# Patient Record
Sex: Male | Born: 1955 | Race: Black or African American | Hispanic: No | Marital: Married | Smoking: Never smoker
Health system: Southern US, Community
[De-identification: ages and names within clinical notes are randomized; demographics above are authoritative.]

## PROBLEM LIST (undated history)

## (undated) DIAGNOSIS — Z8739 Personal history of other diseases of the musculoskeletal system and connective tissue: Secondary | ICD-10-CM

## (undated) DIAGNOSIS — M1990 Unspecified osteoarthritis, unspecified site: Secondary | ICD-10-CM

## (undated) DIAGNOSIS — G4730 Sleep apnea, unspecified: Secondary | ICD-10-CM

## (undated) DIAGNOSIS — I10 Essential (primary) hypertension: Secondary | ICD-10-CM

## (undated) HISTORY — PX: KNEE ARTHROSCOPY: SUR90

---

## 2012-03-29 NOTE — H&P (Signed)
TOTAL HIP ADMISSION H&P  Patient is admitted for right total hip arthroplasty.  Subjective:  Chief Complaint: right hip pain  HPI: Antonio Berg, 57 y.o. male, has a history of pain and functional disability in the right hip(s) due to arthritis and patient has failed non-surgical conservative treatments for greater than 12 weeks to include NSAID's and/or analgesics, use of assistive devices and activity modification.  Onset of symptoms was gradual starting 3 years ago with gradually worsening course since that time.The patient noted no past surgery on the right hip(s).  Patient currently rates pain in the right hip at 7 out of 10 with activity. Patient has night pain, worsening of pain with activity and weight bearing, pain that interfers with activities of daily living and pain with passive range of motion. Patient has evidence of subchondral cysts, periarticular osteophytes and joint space narrowing by imaging studies. This condition presents safety issues increasing the risk of falls.  There is no current active infection.  Past Medical History Sleep Apnea- uses CPAP Hypercholesteremia Hypertension Gout Osteoarthritis   Social History Alcohol use. current drinker; drinks beer; only occasionally per week Children. 1 Current work status. working full time Living situation. live with spouse Marital status. married Tobacco use. former smoker; smoke(d) 1 pack(s) per day  No known drug allergies   History  Substance Use Topics  . Smoking status: Former smoker  . Smokeless tobacco: None  . Alcohol Use: Drinks 2-3 per week    Family History Father deceased age 67 due to complications after a hip fracture Mother deceased age 11 due to complications of Dm type II; history of bilateral BK amputation   Review of Systems  Constitutional: Negative.   HENT: Negative.  Negative for neck pain.   Eyes: Negative.   Respiratory: Negative.   Cardiovascular: Negative.    Gastrointestinal: Negative.   Genitourinary: Negative.   Musculoskeletal: Positive for back pain and joint pain. Negative for myalgias and falls.       Right hip pain  Skin: Negative.   Neurological: Negative.   Endo/Heme/Allergies: Negative.   Psychiatric/Behavioral: Negative.     Objective:  Physical Exam  Constitutional: He is oriented to person, place, and time. He appears well-developed and well-nourished. No distress.  HENT:  Head: Normocephalic and atraumatic.  Right Ear: External ear normal.  Left Ear: External ear normal.  Nose: Nose normal.  Mouth/Throat: Oropharynx is clear and moist.  Eyes: Conjunctivae and EOM are normal.  Neck: Normal range of motion. Neck supple. No tracheal deviation present. No thyromegaly present.  Cardiovascular: Normal rate, regular rhythm, normal heart sounds and intact distal pulses.   No murmur heard. Respiratory: Effort normal. No respiratory distress. He has decreased breath sounds. He has no wheezes. He exhibits no tenderness.  GI: Soft. Bowel sounds are normal. He exhibits no distension and no mass. There is no tenderness.  Musculoskeletal:       Right hip: He exhibits decreased range of motion and decreased strength.       Left hip: Normal.       Right knee: He exhibits decreased range of motion. He exhibits no swelling, no effusion and no erythema. Tenderness found. Medial joint line tenderness noted.       Left knee: He exhibits decreased range of motion. He exhibits no swelling, no effusion and no erythema. Tenderness found. Medial joint line tenderness noted.       Right lower leg: He exhibits no tenderness and no swelling.  Left lower leg: He exhibits no tenderness and no swelling.  Right hip demonstrates no flexion beyond 90 degrees and minimal internal rotation.   Lymphadenopathy:    He has no cervical adenopathy.  Neurological: He is alert and oriented to person, place, and time. He has normal strength and normal  reflexes. No sensory deficit.  Skin: No rash noted. He is not diaphoretic. No erythema.  Psychiatric: He has a normal mood and affect. His behavior is normal.   Vitals Weight: 292 lb Height: 72 in Body Surface Area: 2.59 m Body Mass Index: 39.6 kg/m BP: 118/80 (Sitting, Right Arm, Standard)  Imaging Review Plain radiographs demonstrate severe degenerative joint disease of the right hip(s). The bone quality appears to be fair for age and reported activity level.  Assessment/Plan:  End stage arthritis, right hip(s)  The patient history, physical examination, clinical judgement of the provider and imaging studies are consistent with end stage degenerative joint disease of the right hip(s) and total hip arthroplasty is deemed medically necessary. The treatment options including medical management, injection therapy, arthroscopy and arthroplasty were discussed at length. The risks and benefits of total hip arthroplasty were presented and reviewed. The risks due to aseptic loosening, infection, stiffness, dislocation/subluxation,  thromboembolic complications and other imponderables were discussed.  The patient acknowledged the explanation, agreed to proceed with the plan and consent was signed. Patient is being admitted for inpatient treatment for surgery, pain control, PT, OT, prophylactic antibiotics, VTE prophylaxis, progressive ambulation and ADL's and discharge planning.The patient is planning to be discharged to skilled nursing facility      [place], [place]

## 2012-04-03 ENCOUNTER — Encounter (HOSPITAL_COMMUNITY): Payer: Self-pay

## 2012-04-03 ENCOUNTER — Encounter (HOSPITAL_COMMUNITY)
Admission: RE | Admit: 2012-04-03 | Discharge: 2012-04-03 | Disposition: A | Discharge status: Home / Self Care | Payer: BC Managed Care – PPO | Source: Ambulatory Visit | Attending: Orthopedic Surgery | Admitting: Orthopedic Surgery

## 2012-04-03 ENCOUNTER — Encounter (HOSPITAL_COMMUNITY): Payer: Self-pay | Admitting: Pharmacy Technician

## 2012-04-03 DIAGNOSIS — Z01812 Encounter for preprocedural laboratory examination: Secondary | ICD-10-CM | POA: Insufficient documentation

## 2012-04-03 HISTORY — DX: Sleep apnea, unspecified: G47.30

## 2012-04-03 HISTORY — DX: Essential (primary) hypertension: I10

## 2012-04-03 HISTORY — DX: Unspecified osteoarthritis, unspecified site: M19.90

## 2012-04-03 LAB — COMPREHENSIVE METABOLIC PANEL
ALT: 14 U/L (ref 0–53)
AST: 17 U/L (ref 0–37)
Albumin: 3.8 g/dL (ref 3.5–5.2)
Alkaline Phosphatase: 92 U/L (ref 39–117)
BUN: 19 mg/dL (ref 6–23)
CO2: 31 mEq/L (ref 19–32)
Calcium: 9.6 mg/dL (ref 8.4–10.5)
Chloride: 101 mEq/L (ref 96–112)
Creatinine, Ser: 0.95 mg/dL (ref 0.50–1.35)
GFR calc Af Amer: >90 mL/min (ref >90)
GFR calc non Af Amer: >90 mL/min (ref >90)
Glucose, Bld: 80 mg/dL (ref 70–99)
Potassium: 3.2 mEq/L — ABNORMAL LOW (ref 3.5–5.1)
Sodium: 142 mEq/L (ref 135–145)
Total Bilirubin: 0.2 mg/dL — ABNORMAL LOW (ref 0.3–1.2)
Total Protein: 7.9 g/dL (ref 6.0–8.3)

## 2012-04-03 LAB — URINALYSIS, ROUTINE W REFLEX MICROSCOPIC
Bilirubin Urine: NEGATIVE
Glucose, UA: NEGATIVE mg/dL
Hgb urine dipstick: NEGATIVE
Ketones, ur: NEGATIVE mg/dL
Leukocytes, UA: NEGATIVE
Nitrite: NEGATIVE
Protein, ur: NEGATIVE mg/dL
Specific Gravity, Urine: 1.016 (ref 1.005–1.030)
Urobilinogen, UA: 0.2 mg/dL (ref 0.0–1.0)
pH: 7 (ref 5.0–8.0)

## 2012-04-03 LAB — CBC
HCT: 41.1 % (ref 39.0–52.0)
Hemoglobin: 13.7 g/dL (ref 13.0–17.0)
MCH: 29.4 pg (ref 26.0–34.0)
MCHC: 33.3 g/dL (ref 30.0–36.0)

## 2012-04-03 LAB — PROTIME-INR
INR: 1 (ref 0.00–1.49)
Prothrombin Time: 13.1 seconds (ref 11.6–15.2)

## 2012-04-03 LAB — APTT: aPTT: 33 seconds (ref 24–37)

## 2012-04-03 NOTE — Patient Instructions (Signed)
Latham Kinzler  04/03/2012   Your procedure is scheduled on:  04/11/12   Report to [facility] at   0515  AM.  Call this number if you have problems the morning of surgery: [phone]   Remember:   Do not eat food or drink liquids after midnight.   Take these medicines the morning of surgery with A SIP OF WATER:    Do not wear jewelry,   Do not wear lotions, powders, or perfumes.   . Men may shave face and neck.  Do not bring valuables to the hospital.  Contacts, dentures or bridgework may not be worn into surgery.  Leave suitcase in the car. After surgery it may be brought to your room.  For patients admitted to the hospital, checkout time is 11:00 AM the day of  discharge.   SEE CHG INSTRUCTION SHEET    Please read over the following fact sheets that you were given: MRSA Information, coughing and deep breathing exercises, leg exercises, Blood transfusion Fact Sheet, Incentive Spirometry Fact Sheet                Failure to comply with these instructions may result in cancellation of your surgery.                Patient Signature ____________________________              Nurse Signature _____________________________

## 2012-04-03 NOTE — Progress Notes (Signed)
Your patient has screened at an elevated risk for Obstructive Sleep Apnea using the STOP-Bang Tool during a presurgical visit.  Patient states he has not used CPAP since 2011.  A score of 4 or greater is considered an elevated risk.

## 2012-04-04 NOTE — Progress Notes (Signed)
CMP results faxed to Dr Darrelyn Hillock by [facility] fax.

## 2012-04-04 NOTE — Progress Notes (Signed)
EKG and CXR from 03/08/12 on chart  03/08/12 Office visit note from Dr Daneil Dolin( PCP) on chart  Clearance from Dr Daneil Dolin 03/08/12 on chart

## 2012-04-11 ENCOUNTER — Encounter (HOSPITAL_COMMUNITY): Payer: Self-pay | Admitting: Anesthesiology

## 2012-04-11 ENCOUNTER — Encounter (HOSPITAL_COMMUNITY): Payer: Self-pay | Admitting: *Deleted

## 2012-04-11 ENCOUNTER — Encounter (HOSPITAL_COMMUNITY)
Admission: RE | Disposition: A | Discharge status: Home Health | Payer: Self-pay | Source: Ambulatory Visit | Attending: Orthopedic Surgery

## 2012-04-11 ENCOUNTER — Inpatient Hospital Stay (HOSPITAL_COMMUNITY)
Admission: RE | Admit: 2012-04-11 | Discharge: 2012-04-18 | DRG: 818 | Disposition: A | Discharge status: Home Health | Payer: BC Managed Care – PPO | Source: Ambulatory Visit | Attending: Orthopedic Surgery | Admitting: Orthopedic Surgery

## 2012-04-11 ENCOUNTER — Ambulatory Visit (HOSPITAL_COMMUNITY): Payer: BC Managed Care – PPO | Admitting: Anesthesiology

## 2012-04-11 ENCOUNTER — Ambulatory Visit (HOSPITAL_COMMUNITY): Payer: BC Managed Care – PPO

## 2012-04-11 DIAGNOSIS — M24559 Contracture, unspecified hip: Secondary | ICD-10-CM | POA: Diagnosis present

## 2012-04-11 DIAGNOSIS — M1610 Unilateral primary osteoarthritis, unspecified hip: Principal | ICD-10-CM | POA: Diagnosis present

## 2012-04-11 DIAGNOSIS — K630 Abscess of intestine: Secondary | ICD-10-CM | POA: Diagnosis not present

## 2012-04-11 DIAGNOSIS — M1611 Unilateral primary osteoarthritis, right hip: Secondary | ICD-10-CM | POA: Diagnosis present

## 2012-04-11 DIAGNOSIS — D62 Acute posthemorrhagic anemia: Secondary | ICD-10-CM | POA: Diagnosis not present

## 2012-04-11 DIAGNOSIS — I10 Essential (primary) hypertension: Secondary | ICD-10-CM | POA: Diagnosis present

## 2012-04-11 DIAGNOSIS — K5732 Diverticulitis of large intestine without perforation or abscess without bleeding: Secondary | ICD-10-CM | POA: Diagnosis not present

## 2012-04-11 DIAGNOSIS — M169 Osteoarthritis of hip, unspecified: Principal | ICD-10-CM | POA: Diagnosis present

## 2012-04-11 DIAGNOSIS — K5792 Diverticulitis of intestine, part unspecified, without perforation or abscess without bleeding: Secondary | ICD-10-CM | POA: Diagnosis not present

## 2012-04-11 DIAGNOSIS — Z01812 Encounter for preprocedural laboratory examination: Secondary | ICD-10-CM

## 2012-04-11 DIAGNOSIS — Z96649 Presence of unspecified artificial hip joint: Secondary | ICD-10-CM

## 2012-04-11 DIAGNOSIS — E785 Hyperlipidemia, unspecified: Secondary | ICD-10-CM | POA: Diagnosis present

## 2012-04-11 DIAGNOSIS — R5082 Postprocedural fever: Secondary | ICD-10-CM | POA: Diagnosis not present

## 2012-04-11 DIAGNOSIS — G4733 Obstructive sleep apnea (adult) (pediatric): Secondary | ICD-10-CM | POA: Diagnosis present

## 2012-04-11 HISTORY — PX: TOTAL HIP ARTHROPLASTY: SHX124

## 2012-04-11 LAB — TYPE AND SCREEN
ABO/RH(D): AB POS
Antibody Screen: NEGATIVE

## 2012-04-11 SURGERY — ARTHROPLASTY, HIP, TOTAL,POSTERIOR APPROACH
Anesthesia: General | Site: Hip | Laterality: Right | Wound class: Clean

## 2012-04-11 MED ORDER — ALUM & MAG HYDROXIDE-SIMETH 200-200-20 MG/5ML PO SUSP: 30.0000 mL | ORAL | Status: DC | PRN

## 2012-04-11 MED ORDER — ACETAMINOPHEN 10 MG/ML IV SOLN
INTRAVENOUS | Status: DC | PRN
  Administered 2012-04-11: 1000 mg via INTRAVENOUS

## 2012-04-11 MED ORDER — METOCLOPRAMIDE HCL 5 MG/ML IJ SOLN
INTRAMUSCULAR | Status: DC | PRN
  Administered 2012-04-11: 10 mg via INTRAVENOUS

## 2012-04-11 MED ORDER — KETAMINE HCL 10 MG/ML IJ SOLN
INTRAMUSCULAR | Status: DC | PRN
  Administered 2012-04-11: 10 mg via INTRAVENOUS
  Administered 2012-04-11: 5 mg via INTRAVENOUS
  Administered 2012-04-11: 50 mg via INTRAVENOUS
  Administered 2012-04-11: 5 mg via INTRAVENOUS

## 2012-04-11 MED ORDER — SODIUM CHLORIDE 0.9 % IR SOLN
Status: DC | PRN
  Administered 2012-04-11: 08:00:00

## 2012-04-11 MED ORDER — THROMBIN 5000 UNITS EX SOLR
CUTANEOUS | Status: DC | PRN
  Administered 2012-04-11: 5000 [IU] via TOPICAL

## 2012-04-11 MED ORDER — PHENOL 1.4 % MT LIQD: 1.0000 | OROMUCOSAL | Status: DC | PRN

## 2012-04-11 MED ORDER — LACTATED RINGERS IV SOLN
INTRAVENOUS | Status: DC
  Administered 2012-04-11 (×3): via INTRAVENOUS

## 2012-04-11 MED ORDER — HYDROMORPHONE HCL PF 1 MG/ML IJ SOLN
INTRAMUSCULAR | Status: AC
  Filled 2012-04-11: qty 1

## 2012-04-11 MED ORDER — LACTATED RINGERS IV SOLN: INTRAVENOUS | Status: DC

## 2012-04-11 MED ORDER — FERROUS SULFATE 325 (65 FE) MG PO TABS
325.0000 mg | ORAL_TABLET | Freq: Three times a day (TID) | ORAL | Status: DC
  Administered 2012-04-11 – 2012-04-18 (×21): 325 mg via ORAL
  Filled 2012-04-11 (×24): qty 1

## 2012-04-11 MED ORDER — HYDROMORPHONE HCL PF 1 MG/ML IJ SOLN
INTRAMUSCULAR | Status: DC | PRN
  Administered 2012-04-11: 2 mg via INTRAVENOUS
  Administered 2012-04-11 (×2): 1 mg via INTRAVENOUS

## 2012-04-11 MED ORDER — POTASSIUM CHLORIDE CRYS ER 20 MEQ PO TBCR
20.0000 meq | EXTENDED_RELEASE_TABLET | Freq: Two times a day (BID) | ORAL | Status: DC
  Administered 2012-04-11: 20 meq via ORAL
  Filled 2012-04-11 (×3): qty 1

## 2012-04-11 MED ORDER — PHENYLEPHRINE HCL 10 MG/ML IJ SOLN
INTRAMUSCULAR | Status: DC | PRN
  Administered 2012-04-11: 80 ug via INTRAVENOUS
  Administered 2012-04-11: 160 ug via INTRAVENOUS
  Administered 2012-04-11: 80 ug via INTRAVENOUS
  Administered 2012-04-11: 40 ug via INTRAVENOUS
  Administered 2012-04-11 (×2): 80 ug via INTRAVENOUS

## 2012-04-11 MED ORDER — PHENYLEPHRINE HCL 10 MG/ML IJ SOLN
10.0000 mg | INTRAVENOUS | Status: DC | PRN
  Administered 2012-04-11: 50 ug/min via INTRAVENOUS

## 2012-04-11 MED ORDER — AMLODIPINE BESYLATE 10 MG PO TABS
10.0000 mg | ORAL_TABLET | Freq: Every day | ORAL | Status: DC
  Administered 2012-04-11 – 2012-04-18 (×8): 10 mg via ORAL
  Filled 2012-04-11 (×9): qty 1

## 2012-04-11 MED ORDER — ACETAMINOPHEN 325 MG PO TABS: 650.0000 mg | ORAL_TABLET | Freq: Four times a day (QID) | ORAL | Status: DC | PRN

## 2012-04-11 MED ORDER — PROMETHAZINE HCL 25 MG/ML IJ SOLN: 6.2500 mg | INTRAMUSCULAR | Status: DC | PRN

## 2012-04-11 MED ORDER — ACETAMINOPHEN 10 MG/ML IV SOLN
INTRAVENOUS | Status: AC
  Filled 2012-04-11: qty 100

## 2012-04-11 MED ORDER — CEFAZOLIN SODIUM 1-5 GM-% IV SOLN
1.0000 g | Freq: Four times a day (QID) | INTRAVENOUS | Status: AC
  Administered 2012-04-11 (×2): 1 g via INTRAVENOUS
  Filled 2012-04-11 (×2): qty 50

## 2012-04-11 MED ORDER — CEFAZOLIN SODIUM 1-5 GM-% IV SOLN
INTRAVENOUS | Status: AC
  Filled 2012-04-11: qty 50

## 2012-04-11 MED ORDER — HYDROMORPHONE 0.3 MG/ML IV SOLN
INTRAVENOUS | Status: DC
  Administered 2012-04-11: 2.7 mg via INTRAVENOUS
  Administered 2012-04-11: 11:00:00 via INTRAVENOUS
  Administered 2012-04-11 (×2): 3 mg via INTRAVENOUS
  Administered 2012-04-12: 2.7 mg via INTRAVENOUS
  Filled 2012-04-11 (×2): qty 25

## 2012-04-11 MED ORDER — BUPIVACAINE LIPOSOME 1.3 % IJ SUSP
20.0000 mL | Freq: Once | INTRAMUSCULAR | Status: DC
  Filled 2012-04-11: qty 20

## 2012-04-11 MED ORDER — HYDROMORPHONE 0.3 MG/ML IV SOLN
INTRAVENOUS | Status: AC
  Filled 2012-04-11: qty 25

## 2012-04-11 MED ORDER — METHOCARBAMOL 500 MG PO TABS
ORAL_TABLET | ORAL | Status: AC
  Administered 2012-04-12: 500 mg via ORAL
  Filled 2012-04-11: qty 1

## 2012-04-11 MED ORDER — METHOCARBAMOL 500 MG PO TABS
500.0000 mg | ORAL_TABLET | Freq: Four times a day (QID) | ORAL | Status: DC | PRN
  Administered 2012-04-11 – 2012-04-18 (×10): 500 mg via ORAL
  Filled 2012-04-11 (×10): qty 1

## 2012-04-11 MED ORDER — OXYCODONE-ACETAMINOPHEN 5-325 MG PO TABS: 1.0000 | ORAL_TABLET | ORAL | Status: DC | PRN

## 2012-04-11 MED ORDER — BISACODYL 10 MG RE SUPP: 10.0000 mg | Freq: Every day | RECTAL | Status: DC | PRN

## 2012-04-11 MED ORDER — ONDANSETRON HCL 4 MG/2ML IJ SOLN
4.0000 mg | Freq: Four times a day (QID) | INTRAMUSCULAR | Status: DC | PRN
  Administered 2012-04-16: 4 mg via INTRAVENOUS
  Filled 2012-04-11: qty 2

## 2012-04-11 MED ORDER — ONDANSETRON HCL 4 MG PO TABS: 4.0000 mg | ORAL_TABLET | Freq: Four times a day (QID) | ORAL | Status: DC | PRN

## 2012-04-11 MED ORDER — 0.9 % SODIUM CHLORIDE (POUR BTL) OPTIME
TOPICAL | Status: DC | PRN
  Administered 2012-04-11: 1000 mL

## 2012-04-11 MED ORDER — OXYCODONE-ACETAMINOPHEN 10-325 MG PO TABS: 1.0000 | ORAL_TABLET | ORAL | Status: DC | PRN

## 2012-04-11 MED ORDER — DEXTROSE 5 % IV SOLN
3.0000 g | INTRAVENOUS | Status: AC
  Administered 2012-04-11: 3 g via INTRAVENOUS

## 2012-04-11 MED ORDER — FENTANYL CITRATE 0.05 MG/ML IJ SOLN
INTRAMUSCULAR | Status: DC | PRN
  Administered 2012-04-11: 25 ug via INTRAVENOUS
  Administered 2012-04-11: 100 ug via INTRAVENOUS
  Administered 2012-04-11: 150 ug via INTRAVENOUS
  Administered 2012-04-11 (×2): 50 ug via INTRAVENOUS

## 2012-04-11 MED ORDER — FLEET ENEMA 7-19 GM/118ML RE ENEM: 1.0000 | ENEMA | Freq: Once | RECTAL | Status: AC | PRN

## 2012-04-11 MED ORDER — CEFAZOLIN SODIUM-DEXTROSE 2-3 GM-% IV SOLR
INTRAVENOUS | Status: AC
  Filled 2012-04-11: qty 50

## 2012-04-11 MED ORDER — HYDROMORPHONE HCL 2 MG PO TABS
2.0000 mg | ORAL_TABLET | Freq: Three times a day (TID) | ORAL | Status: DC
  Administered 2012-04-11 – 2012-04-18 (×21): 2 mg via ORAL
  Filled 2012-04-11 (×21): qty 1

## 2012-04-11 MED ORDER — BUPIVACAINE LIPOSOME 1.3 % IJ SUSP
INTRAMUSCULAR | Status: DC | PRN
  Administered 2012-04-11: 09:00:00

## 2012-04-11 MED ORDER — ACETAMINOPHEN 650 MG RE SUPP: 650.0000 mg | Freq: Four times a day (QID) | RECTAL | Status: DC | PRN

## 2012-04-11 MED ORDER — MIDAZOLAM HCL 5 MG/5ML IJ SOLN
INTRAMUSCULAR | Status: DC | PRN
  Administered 2012-04-11: 2 mg via INTRAVENOUS

## 2012-04-11 MED ORDER — TRIAMTERENE-HCTZ 37.5-25 MG PO CAPS
1.0000 | ORAL_CAPSULE | Freq: Two times a day (BID) | ORAL | Status: DC
  Administered 2012-04-11 – 2012-04-14 (×5): 1 via ORAL
  Filled 2012-04-11 (×8): qty 1

## 2012-04-11 MED ORDER — CHLORHEXIDINE GLUCONATE 4 % EX LIQD
60.0000 mL | Freq: Once | CUTANEOUS | Status: DC
  Filled 2012-04-11: qty 60

## 2012-04-11 MED ORDER — METHOCARBAMOL 100 MG/ML IJ SOLN: 500.0000 mg | Freq: Four times a day (QID) | INTRAVENOUS | Status: DC | PRN

## 2012-04-11 MED ORDER — ALLOPURINOL 100 MG PO TABS
100.0000 mg | ORAL_TABLET | Freq: Two times a day (BID) | ORAL | Status: DC
  Administered 2012-04-11 – 2012-04-17 (×13): 100 mg via ORAL
  Filled 2012-04-11 (×15): qty 1

## 2012-04-11 MED ORDER — MENTHOL 3 MG MT LOZG: 1.0000 | LOZENGE | OROMUCOSAL | Status: DC | PRN

## 2012-04-11 MED ORDER — MEPERIDINE HCL 50 MG/ML IJ SOLN
6.2500 mg | INTRAMUSCULAR | Status: DC | PRN
  Administered 2012-04-11: 12.5 mg via INTRAVENOUS

## 2012-04-11 MED ORDER — POLYETHYLENE GLYCOL 3350 17 G PO PACK
17.0000 g | PACK | Freq: Every day | ORAL | Status: DC | PRN
  Filled 2012-04-11: qty 1

## 2012-04-11 MED ORDER — OXYCODONE HCL 5 MG PO TABS
5.0000 mg | ORAL_TABLET | ORAL | Status: DC | PRN
  Administered 2012-04-12: 5 mg via ORAL
  Filled 2012-04-11: qty 1

## 2012-04-11 MED ORDER — HYDROMORPHONE HCL PF 1 MG/ML IJ SOLN
0.2500 mg | INTRAMUSCULAR | Status: DC | PRN
  Administered 2012-04-11 (×4): 0.5 mg via INTRAVENOUS

## 2012-04-11 MED ORDER — THROMBIN 5000 UNITS EX SOLR
CUTANEOUS | Status: AC
  Filled 2012-04-11: qty 5000

## 2012-04-11 MED ORDER — BACITRACIN ZINC 500 UNIT/GM EX OINT
TOPICAL_OINTMENT | CUTANEOUS | Status: AC
  Filled 2012-04-11: qty 15

## 2012-04-11 MED ORDER — LACTATED RINGERS IV SOLN
INTRAVENOUS | Status: DC
  Administered 2012-04-11 – 2012-04-12 (×2): via INTRAVENOUS

## 2012-04-11 MED ORDER — ROCURONIUM BROMIDE 100 MG/10ML IV SOLN
INTRAVENOUS | Status: DC | PRN
  Administered 2012-04-11: 50 mg via INTRAVENOUS

## 2012-04-11 MED ORDER — RIVAROXABAN 10 MG PO TABS
10.0000 mg | ORAL_TABLET | Freq: Every day | ORAL | Status: DC
  Administered 2012-04-12 – 2012-04-15 (×4): 10 mg via ORAL
  Filled 2012-04-11 (×5): qty 1

## 2012-04-11 MED ORDER — MEPERIDINE HCL 50 MG/ML IJ SOLN
INTRAMUSCULAR | Status: AC
  Filled 2012-04-11: qty 1

## 2012-04-11 SURGICAL SUPPLY — 52 items
BAG ZIPLOCK 12X15 (MISCELLANEOUS) ×2 IMPLANT
BLADE SAW SAG 73X25 THK (BLADE) ×1
BLADE SAW SGTL 73X25 THK (BLADE) ×1 IMPLANT
BNDG COHESIVE 6X5 TAN STRL LF (GAUZE/BANDAGES/DRESSINGS) ×2 IMPLANT
CLOTH BEACON ORANGE TIMEOUT ST (SAFETY) ×2 IMPLANT
DRAPE INCISE IOBAN 66X45 STRL (DRAPES) ×2 IMPLANT
DRAPE INCISE IOBAN 85X60 (DRAPES) ×2 IMPLANT
DRAPE ORTHO SPLIT 77X108 STRL (DRAPES) ×2
DRAPE POUCH INSTRU U-SHP 10X18 (DRAPES) ×2 IMPLANT
DRAPE SURG 17X11 SM STRL (DRAPES) ×2 IMPLANT
DRAPE SURG ORHT 6 SPLT 77X108 (DRAPES) ×2 IMPLANT
DRAPE U-SHAPE 47X51 STRL (DRAPES) ×2 IMPLANT
DRSG EMULSION OIL 3X16 NADH (GAUZE/BANDAGES/DRESSINGS) ×2 IMPLANT
DRSG MEPILEX BORDER 4X12 (GAUZE/BANDAGES/DRESSINGS) ×2 IMPLANT
DRSG MEPILEX BORDER 4X4 (GAUZE/BANDAGES/DRESSINGS) ×6 IMPLANT
DRSG PAD ABDOMINAL 8X10 ST (GAUZE/BANDAGES/DRESSINGS) IMPLANT
DURAPREP 26ML APPLICATOR (WOUND CARE) ×2 IMPLANT
ELECT BLADE TIP CTD 4 INCH (ELECTRODE) ×2 IMPLANT
ELECT REM PT RETURN 9FT ADLT (ELECTROSURGICAL) ×2
ELECTRODE REM PT RTRN 9FT ADLT (ELECTROSURGICAL) ×1 IMPLANT
EVACUATOR 1/8 PVC DRAIN (DRAIN) ×2 IMPLANT
FACESHIELD LNG OPTICON STERILE (SAFETY) ×10 IMPLANT
FLOSEAL 10ML (HEMOSTASIS) ×2 IMPLANT
GLOVE BIOGEL M 6.5 STRL (GLOVE) ×2 IMPLANT
GLOVE BIOGEL PI IND STRL 8 (GLOVE) ×1 IMPLANT
GLOVE BIOGEL PI IND STRL 8.5 (GLOVE) ×1 IMPLANT
GLOVE BIOGEL PI INDICATOR 8 (GLOVE) ×1
GLOVE BIOGEL PI INDICATOR 8.5 (GLOVE) ×1
GLOVE ECLIPSE 8.0 STRL XLNG CF (GLOVE) ×8 IMPLANT
GOWN PREVENTION PLUS LG XLONG (DISPOSABLE) ×4 IMPLANT
GOWN STRL REIN XL XLG (GOWN DISPOSABLE) ×4 IMPLANT
IMMOBILIZER KNEE 20 (SOFTGOODS)
IMMOBILIZER KNEE 20 THIGH 36 (SOFTGOODS) IMPLANT
KIT BASIN OR (CUSTOM PROCEDURE TRAY) ×2 IMPLANT
MANIFOLD NEPTUNE II (INSTRUMENTS) ×2 IMPLANT
NEEDLE HYPO 22GX1.5 SAFETY (NEEDLE) ×2 IMPLANT
PACK TOTAL JOINT (CUSTOM PROCEDURE TRAY) ×2 IMPLANT
POSITIONER SURGICAL ARM (MISCELLANEOUS) ×2 IMPLANT
SPONGE GAUZE 4X4 12PLY (GAUZE/BANDAGES/DRESSINGS) ×2 IMPLANT
SPONGE LAP 18X18 X RAY DECT (DISPOSABLE) ×6 IMPLANT
SPONGE LAP 4X18 X RAY DECT (DISPOSABLE) ×2 IMPLANT
SPONGE SURGIFOAM ABS GEL 100 (HEMOSTASIS) ×2 IMPLANT
STAPLER VISISTAT 35W (STAPLE) ×2 IMPLANT
SUCTION FRAZIER TIP 10 FR DISP (SUCTIONS) ×2 IMPLANT
SUT VIC AB 0 CT1 27 (SUTURE) ×2
SUT VIC AB 0 CT1 27XBRD ANTBC (SUTURE) ×2 IMPLANT
SUT VIC AB 1 CT1 27 (SUTURE) ×5
SUT VIC AB 1 CT1 27XBRD ANTBC (SUTURE) ×5 IMPLANT
SYR 20CC LL (SYRINGE) ×2 IMPLANT
TOWEL OR 17X26 10 PK STRL BLUE (TOWEL DISPOSABLE) ×4 IMPLANT
TRAY FOLEY CATH 14FRSI W/METER (CATHETERS) ×2 IMPLANT
WATER STERILE IRR 1500ML POUR (IV SOLUTION) ×2 IMPLANT

## 2012-04-11 NOTE — Anesthesia Postprocedure Evaluation (Signed)
  Anesthesia Post-op Note  Patient: Antonio Berg  Procedure(s) Performed: Procedure(s) (LRB): TOTAL HIP ARTHROPLASTY (Right)  Patient Location: PACU  Anesthesia Type: General  Level of Consciousness: awake and alert   Airway and Oxygen Therapy: Patient Spontanous Breathing  Post-op Pain: mild  Post-op Assessment: Post-op Vital signs reviewed, Patient's Cardiovascular Status Stable, Respiratory Function Stable, Patent Airway and No signs of Nausea or vomiting  Last Vitals:  Filed Vitals:   04/11/12 1000  BP: 125/97  Pulse: 77  Temp:   Resp: 21    Post-op Vital Signs: stable   Complications: No apparent anesthesia complications

## 2012-04-11 NOTE — Progress Notes (Signed)
Utilization review completed.  

## 2012-04-11 NOTE — Transfer of Care (Signed)
Immediate Anesthesia Transfer of Care Note  Patient: Antonio Berg  Procedure(s) Performed: Procedure(s): TOTAL HIP ARTHROPLASTY (Right)  Patient Location: PACU  Anesthesia Type:General  Level of Consciousness: awake, patient cooperative and responds to stimulation  Airway & Oxygen Therapy: Patient Spontanous Breathing and Patient connected to face mask oxygen  Post-op Assessment: Report given to PACU RN, Post -op Vital signs reviewed and stable and Patient moving all extremities  Post vital signs: Reviewed and stable  Complications: No apparent anesthesia complications

## 2012-04-11 NOTE — Progress Notes (Signed)
Clinical Social Work Department CLINICAL SOCIAL WORK PLACEMENT NOTE 04/11/2012  Patient:  Antonio Berg, Antonio Berg  Account Number:  [id number] Admit date:  04/11/2012  Clinical Social Worker:  Cori Razor, LCSW  Date/time:  04/11/2012 03:16 PM  Clinical Social Work is seeking post-discharge placement for this patient at the following level of care:   SKILLED NURSING   (*CSW will update this form in Epic as items are completed)   04/11/2012  Patient/family provided with [facility] Department of Clinical Social Work's list of facilities offering this level of care within the geographic area requested by the patient (or if unable, by the patient's family).  04/11/2012  Patient/family informed of their freedom to choose among providers that offer the needed level of care, that participate in Medicare, Medicaid or managed care program needed by the patient, have an available bed and are willing to accept the patient.    Patient/family informed of [facility]' ownership interest in [facility], as well as of the fact that they are under no obligation to receive care at this facility.  PASARR submitted to EDS on 04/11/2012 PASARR number received from EDS on 04/11/2012  FL2 transmitted to all facilities in geographic area requested by pt/family on  04/11/2012 FL2 transmitted to all facilities within larger geographic area on   Patient informed that his/her managed care company has contracts with or will negotiate with  certain facilities, including the following:     Patient/family informed of bed offers received:   Patient chooses bed at  Physician recommends and patient chooses bed at    Patient to be transferred to  on   Patient to be transferred to facility by   The following physician request were entered in Epic:   Additional Comments:  Cori Razor LCSW [phone]

## 2012-04-11 NOTE — Interval H&P Note (Signed)
History and Physical Interval Note:  04/11/2012 7:09 AM  Shelia Media  has presented today for surgery, with the diagnosis of OA RIGHT HIP   The various methods of treatment have been discussed with the patient and family. After consideration of risks, benefits and other options for treatment, the patient has consented to  Procedure(s): TOTAL HIP ARTHROPLASTY (Right) as a surgical intervention .  The patient's history has been reviewed, patient examined, no change in status, stable for surgery.  I have reviewed the patient's chart and labs.  Questions were answered to the patient's satisfaction.     [name] A

## 2012-04-11 NOTE — Anesthesia Preprocedure Evaluation (Addendum)
Anesthesia Evaluation  Patient identified by MRN, date of birth, ID band Patient awake    Reviewed: Allergy & Precautions, H&P , NPO status , Patient's Chart, lab work & pertinent test results  Airway Mallampati: II  TM Distance: >3 FB Neck ROM: Full    Dental no notable dental hx. (+) Partial Upper   Pulmonary neg pulmonary ROS, sleep apnea , former smoker,  breath sounds clear to auscultation  Pulmonary exam normal       Cardiovascular hypertension, Pt. on medications negative cardio ROS  Rhythm:Regular Rate:Normal     Neuro/Psych negative neurological ROS  negative psych ROS   GI/Hepatic negative GI ROS, Neg liver ROS,   Endo/Other  negative endocrine ROS  Renal/GU negative Renal ROS  negative genitourinary   Musculoskeletal negative musculoskeletal ROS (+)   Abdominal   Peds negative pediatric ROS (+)  Hematology negative hematology ROS (+)   Anesthesia Other Findings   Reproductive/Obstetrics negative OB ROS                             Anesthesia Physical  Anesthesia Plan  ASA: II  Anesthesia Plan: General   Post-op Pain Management:    Induction: Intravenous  Airway Management Planned: Oral ETT  Additional Equipment:   Intra-op Plan:   Post-operative Plan: Extubation in OR  Informed Consent: I have reviewed the patients History and Physical, chart, labs and discussed the procedure including the risks, benefits and alternatives for the proposed anesthesia with the patient or authorized representative who has indicated his/her understanding and acceptance.   Dental advisory given  Plan Discussed with: CRNA  Anesthesia Plan Comments:         Anesthesia Quick Evaluation  

## 2012-04-11 NOTE — Preoperative (Signed)
Beta Blockers   Reason not to administer Beta Blockers:Not Applicable, not on home BB 

## 2012-04-11 NOTE — Progress Notes (Signed)
Clinical Social Work Department BRIEF PSYCHOSOCIAL ASSESSMENT 04/11/2012  Patient:  Antonio Berg, Antonio Berg     Account Number:  [id number]     Admit date:  04/11/2012  Clinical Social Worker:  Candie Chroman  Date/Time:  04/11/2012 03:25 PM  Referred by:  Physician  Date Referred:  04/11/2012 Referred for  SNF Placement   Other Referral:   Interview type:  Patient Other interview type:    PSYCHOSOCIAL DATA Living Status:  WIFE Admitted from facility:   Level of care:   Primary support name:  Kathie Rhodes Primary support relationship to patient:  SPOUSE Degree of support available:   supportive    CURRENT CONCERNS Current Concerns  Post-Acute Placement   Other Concerns:    SOCIAL WORK ASSESSMENT / PLAN Pt is a 57 yr old gentleman living at home prior to hospitalization. CSW met with pt/spouse to assist with d/c planning. [place] d/c . CSW will initiate SNF search an provide bed offers as received. CSW will continue to follow to assist with d/c planning to SNF.   Assessment/plan status:  Psychosocial Support/Ongoing Assessment of Needs Other assessment/ plan:   Information/referral to community resources:   SNF list provided. [employer] insurance authorization for SNF placement reviewed.    PATIENT'S/FAMILY'S RESPONSE TO PLAN OF CARE: Pt would like to have ST Rehab before returning home. CSW explained BCBS will need to provide authorization for SNF prior to d/c in order for insurance to cover cost of placement. Spouse indicated understanding.   Cori Razor LCSW [phone]

## 2012-04-11 NOTE — Brief Op Note (Signed)
04/11/2012  9:30 AM  PATIENT:  Antonio Berg  57 y.o. male  PRE-OPERATIVE DIAGNOSIS:  OA RIGHT HIP,VERY COMPLEX.and Morbid Obesity.  POST-OPERATIVE DIAGNOSIS:  OA RIGHT HIP,VERY COMPLEX with Morbid Obesity.  PROCEDURE:  Procedure(s): TOTAL HIP ARTHROPLASTY (Right) with Bone Graft to Acetabulum.Complex Procedure.  SURGEON:  Surgeon(s) and Role:    * Jacki Cones, MD - Primary    * Shelda Pal, MD - Assisting and Annamary Rummage  PHYSICIAN ASSISTANT: Freddie Breech pa  ASSISTANTS: mATT oLIN md  ANESTHESIA:   general  EBL:  Total I/O In: 1000 [I.V.:1000] Out: 850 [Urine:100; Blood:750]  BLOOD ADMINISTERED:none  DRAINS: (ONE) Hemovact drain(s) in the rIGHT hIP with  Suction Open   LOCAL MEDICATIONS USED:  BUPIVICAINE 20CC MIXED WITH 20CC OF NORMAL SALINE  SPECIMEN:  No Specimen  DISPOSITION OF SPECIMEN:  N/A  COUNTS:  YES  TOURNIQUET:  * No tourniquets in log *  DICTATION: .Other Dictation: Dictation Number [id number]  PLAN OF CARE: Admit to inpatient   PATIENT DISPOSITION:  Admit   Delay start of Pharmacological VTE agent (>24hrs) due to surgical blood loss or risk of bleeding: yes

## 2012-04-11 NOTE — Anesthesia Procedure Notes (Addendum)
Procedure Name: MAC Date/Time: 04/11/2012 7:35 AM Performed by: Randon Goldsmith CATHERINE PAYNE Pre-anesthesia Checklist: Patient identified, Emergency Drugs available, Suction available and Patient being monitored Patient Re-evaluated:Patient Re-evaluated prior to inductionOxygen Delivery Method: Circle system utilized Preoxygenation: Pre-oxygenation with 100% oxygen Intubation Type: IV induction Ventilation: Mask ventilation without difficulty and Oral airway inserted - appropriate to patient size Laryngoscope Size: Mac and 4 Grade View: Grade II Tube type: Oral Tube size: 7.5 mm Number of attempts: 2 Airway Equipment and Method: Bougie stylet Placement Confirmation: positive ETCO2 and breath sounds checked- equal and bilateral Secured at: 21 cm Tube secured with: Tape Dental Injury: Teeth and Oropharynx as per pre-operative assessment  Comments: 0732- DVL x 1 with mac 4  By CRNA, grade 2 view, AOI, 3 breaths given, gastric secretions noted in  ETT, 2952-  ETT immediately suctioned and removed. Patient bagged with 100 % O2. Sats down to mid 80's, quickly came up to 100%.0735-  DVL x 1 by MDA, grade 2 view, AOI with bougie stylet. Position confirmed with Bil. Breath sounds, + EtCO2, and equal chest rise and fall. Ett suctioned out, no secretions present. OGT inserted atraumatically, no resistance, no heme, + bile draining into canister via low suction, 200 ml secretions emptied quickly from OGT, then placed to gravity to drain.

## 2012-04-12 ENCOUNTER — Encounter (HOSPITAL_COMMUNITY): Payer: Self-pay | Admitting: Orthopedic Surgery

## 2012-04-12 DIAGNOSIS — D62 Acute posthemorrhagic anemia: Secondary | ICD-10-CM | POA: Diagnosis not present

## 2012-04-12 LAB — BASIC METABOLIC PANEL
CO2: 26 mEq/L (ref 19–32)
Chloride: 99 mEq/L (ref 96–112)
Creatinine, Ser: 0.83 mg/dL (ref 0.50–1.35)

## 2012-04-12 LAB — CBC
MCV: 86.3 fL (ref 78.0–100.0)
Platelets: 225 10*3/uL (ref 150–400)
RBC: 3.44 MIL/uL — ABNORMAL LOW (ref 4.22–5.81)
WBC: 11.6 10*3/uL — ABNORMAL HIGH (ref 4.0–10.5)

## 2012-04-12 MED ORDER — OXYCODONE-ACETAMINOPHEN 5-325 MG PO TABS
1.0000 | ORAL_TABLET | ORAL | Status: DC | PRN
  Administered 2012-04-13 – 2012-04-18 (×9): 1 via ORAL
  Filled 2012-04-12 (×9): qty 1

## 2012-04-12 MED ORDER — POTASSIUM CHLORIDE CRYS ER 20 MEQ PO TBCR
40.0000 meq | EXTENDED_RELEASE_TABLET | Freq: Two times a day (BID) | ORAL | Status: DC
  Administered 2012-04-12 – 2012-04-17 (×12): 40 meq via ORAL
  Filled 2012-04-12 (×14): qty 2

## 2012-04-12 MED ORDER — OXYCODONE HCL 5 MG PO TABS
5.0000 mg | ORAL_TABLET | ORAL | Status: DC | PRN
  Administered 2012-04-12 (×2): 15 mg via ORAL
  Administered 2012-04-12: 10 mg via ORAL
  Administered 2012-04-12 – 2012-04-13 (×4): 15 mg via ORAL
  Administered 2012-04-15 (×3): 5 mg via ORAL
  Administered 2012-04-16: 10 mg via ORAL
  Administered 2012-04-16 – 2012-04-18 (×8): 15 mg via ORAL
  Filled 2012-04-12 (×3): qty 3
  Filled 2012-04-12: qty 1
  Filled 2012-04-12: qty 3
  Filled 2012-04-12 (×2): qty 1
  Filled 2012-04-12: qty 3
  Filled 2012-04-12 (×2): qty 2
  Filled 2012-04-12 (×9): qty 3

## 2012-04-12 NOTE — Progress Notes (Signed)
Subjective: 1 Day Post-Op Procedure(s) (LRB): TOTAL HIP ARTHROPLASTY (Right) Patient reports pain as 4 on 0-10 scale.up ion chair this afternoon and feeling much better. Drain is out.   Objective: Vital signs in last 24 hours: Temp:  [98.3 F (36.8 C)-99.7 F (37.6 C)] 98.4 F (36.9 C) (03/28 1040) Pulse Rate:  [72-98] 81 (03/28 1040) Resp:  [14-20] 16 (03/28 1040) BP: (104-159)/(67-88) 115/76 mmHg (03/28 1040) SpO2:  [98 %-100 %] 98 % (03/28 1040) FiO2 (%):  [99 %-100 %] 99 % (03/28 0350)  Intake/Output from previous day: 03/27 0701 - 03/28 0700 In: 5185 [P.O.:960; I.V.:4125; IV Piggyback:100] Out: 3655 [Urine:2275; Drains:630; Blood:750] Intake/Output this shift: Total I/O In: 480 [P.O.:480] Out: 475 [Urine:475]   Recent Labs  04/12/12 0430  HGB 10.2*    Recent Labs  04/12/12 0430  WBC 11.6*  RBC 3.44*  HCT 29.7*  PLT 225    Recent Labs  04/12/12 0430  NA 136  K 2.9*  CL 99  CO2 26  BUN 16  CREATININE 0.83  GLUCOSE 137*  CALCIUM 8.4   No results found for this basename: LABPT, INR,  in the last 72 hours  Dorsiflexion/Plantar flexion intact  Assessment/Plan: 1 Day Post-Op Procedure(s) (LRB): TOTAL HIP ARTHROPLASTY (Right) Up with therapy,Planning SNF for Monday.  Giovanna Kemmerer A 04/12/2012, 1:49 PM

## 2012-04-12 NOTE — Progress Notes (Signed)
   Subjective: 1 Day Post-Op Procedure(s) (LRB): TOTAL HIP ARTHROPLASTY (Right) Patient reports pain as moderate.   Patient seen in rounds without Dr. Darrelyn Hillock. Patient is well, but has had some minor complaints of pain in the right hip, requiring pain medications. He denies shortness of breath and chest pain. Reports that he did get some rest last night.  We will start therapy today.  Plan is to go Skilled nursing facility after hospital stay.  Objective: Vital signs in last 24 hours: Temp:  [97.6 F (36.4 C)-99.7 F (37.6 C)] 98.6 F (37 C) (03/28 0510) Pulse Rate:  [72-98] 86 (03/28 0510) Resp:  [12-22] 16 (03/28 0510) BP: (104-180)/(67-97) 104/71 mmHg (03/28 0510) SpO2:  [93 %-100 %] 98 % (03/28 0510) FiO2 (%):  [99 %-100 %] 99 % (03/28 0350) Weight:  [129.275 kg (285 lb)] 129.275 kg (285 lb) (03/27 1100)  Intake/Output from previous day:  Intake/Output Summary (Last 24 hours) at 04/12/12 0743 Last data filed at 04/12/12 0600  Gross per 24 hour  Intake   5185 ml  Output   3655 ml  Net   1530 ml     Labs:  Recent Labs  04/12/12 0430  HGB 10.2*    Recent Labs  04/12/12 0430  WBC 11.6*  RBC 3.44*  HCT 29.7*  PLT 225    Recent Labs  04/12/12 0430  NA 136  K 2.9*  CL 99  CO2 26  BUN 16  CREATININE 0.83  GLUCOSE 137*  CALCIUM 8.4    EXAM General - Patient is Alert and Oriented Extremity - Neurologically intact Dorsiflexion/Plantar flexion intact Incision: scant drainage Compartment soft Dressing changed Motor Function - intact, moving foot and toes well on exam.  Hemovac pulled without difficulty.  Past Medical History  Diagnosis Date  . Hypertension   . Sleep apnea     not used since 2011   . Arthritis     Assessment/Plan: 1 Day Post-Op Procedure(s) (LRB): TOTAL HIP ARTHROPLASTY (Right) Active Problems:   Osteoarthritis of right hip  Estimated body mass index is 37.61 kg/(m^2) as calculated from the following:   Height as of this  encounter: 6\' 1"  (1.854 m).   Weight as of this encounter: 129.275 kg (285 lb). Advance diet Up with therapy Discharge to SNF tentative Monday  DVT Prophylaxis - Xarelto PWB 50% right leg D/C Knee Immobilizer Hemovac Pulled Begin Therapy Hip Preacutions  Potassium is low at 2.9. Will increase KDur to BID. Will recheck labs in the morning. Hgb stable at 10.2 this morning. Reinforce dressing as needed. Discontinued PCA and will manage pain with PO meds.  Antonio Berg LAUREN 04/12/2012, 7:43 AM

## 2012-04-12 NOTE — Progress Notes (Signed)
CSW assisting with d/c planning. Pt has chosen [facility] for ST SNF placement. SNF is able to admit once BCBS have provided authorization for placement. CSW will provide clinical info once PT / OT Evals are available. SNF is able to provide a pvt room as requested by pt. CSW will follow to assist with d/c planning to SNF.  Cori Razor LCSW [phone]

## 2012-04-12 NOTE — Evaluation (Signed)
Physical Therapy Evaluation Patient Details Name: Antonio Berg MRN: [id number] DOB: November 20, 1955 Today's Date: 04/12/2012 Time: 7829-5621 PT Time Calculation (min): 36 min  PT Assessment / Plan / Recommendation Clinical Impression  Pt s/p R THR presents with decreased R LE strength/ROM, post THP, PWB status and post op pain limiting functional mobility    PT Assessment  Patient needs continued PT services    Follow Up Recommendations  SNF    Does the patient have the potential to tolerate intense rehabilitation      Barriers to Discharge        Equipment Recommendations  Rolling walker with 5" wheels    Recommendations for Other Services OT consult   Frequency 7X/week    Precautions / Restrictions Precautions Precautions: Posterior Hip;Fall Precaution Comments: Sign hung in room Restrictions Weight Bearing Restrictions: Yes RLE Weight Bearing: Partial weight bearing RLE Partial Weight Bearing Percentage or Pounds: 50%   Pertinent Vitals/Pain 9/10; premed, ice pack provided, RN aware      Mobility  Bed Mobility Bed Mobility: Supine to Sit Supine to Sit: 1: +2 Total assist Supine to Sit: Patient Percentage: 50% Details for Bed Mobility Assistance: cues for sequence, THP and use of L LE to self assist.  Physical assist to manage R LE and to bring trunk to upright Transfers Transfers: Sit to Stand;Stand to Sit Sit to Stand: 1: +2 Total assist Sit to Stand: Patient Percentage: 60% Stand to Sit: 1: +2 Total assist Stand to Sit: Patient Percentage: 60% Details for Transfer Assistance: cues for LE management, adherence to THP and use of UEs to self assist Ambulation/Gait Ambulation/Gait Assistance: 1: +2 Total assist Ambulation/Gait: Patient Percentage: 80% Ambulation Distance (Feet): 31 Feet Assistive device: Rolling walker Ambulation/Gait Assistance Details: cues for sequence, posture, and position from RW Gait Pattern: Step-to pattern    Exercises Total Joint  Exercises Ankle Circles/Pumps: AROM;Both;15 reps;Supine Quad Sets: AROM;Both;10 reps;Supine Heel Slides: AAROM;10 reps;Supine;Right Hip ABduction/ADduction: AAROM;10 reps;Supine;Right   PT Diagnosis: Difficulty walking  PT Problem List: Decreased strength;Decreased range of motion;Decreased activity tolerance;Decreased mobility;Decreased knowledge of use of DME;Obesity;Pain;Decreased knowledge of precautions PT Treatment Interventions: DME instruction;Gait training;Stair training;Functional mobility training;Therapeutic activities;Therapeutic exercise;Patient/family education   PT Goals Acute Rehab PT Goals PT Goal Formulation: With patient Time For Goal Achievement: 04/17/12 Potential to Achieve Goals: Good Pt will go Supine/Side to Sit: with supervision PT Goal: Supine/Side to Sit - Progress: Goal set today Pt will go Sit to Supine/Side: with supervision PT Goal: Sit to Supine/Side - Progress: Goal set today Pt will go Sit to Stand: with supervision PT Goal: Sit to Stand - Progress: Goal set today Pt will go Stand to Sit: with supervision PT Goal: Stand to Sit - Progress: Goal set today Pt will Ambulate: 51 - 150 feet;with supervision;with rolling walker PT Goal: Ambulate - Progress: Goal set today  Visit Information  Last PT Received On: 04/12/12 Assistance Needed: +2    Subjective Data  Subjective: I had to pull myself up and down on the scaffold at work Patient Stated Goal: Get back to work ad the [employer] ( )   Prior Functioning  Home Living Lives With: Spouse Available Help at Discharge: Family Type of Home: House Home Access: Level entry Home Layout: One level Home Adaptive Equipment: Straight cane Prior Function Level of Independence: Independent Driving: Yes Vocation: Full time employment Communication Communication: No difficulties    Cognition  Cognition Overall Cognitive Status: Appears within functional limits for tasks  assessed/performed Arousal/Alertness: Awake/alert Orientation  Level: Appears intact for tasks assessed Behavior During Session: [facility] for tasks performed    Extremity/Trunk Assessment Right Upper Extremity Assessment RUE ROM/Strength/Tone: [facility] for tasks assessed Left Upper Extremity Assessment LUE ROM/Strength/Tone: WFL for tasks assessed Right Lower Extremity Assessment RLE ROM/Strength/Tone: Deficits RLE ROM/Strength/Tone Deficits: Hip strength 2/5 with AAROM to 80 flex and 25 abd Left Lower Extremity Assessment LLE ROM/Strength/Tone: WFL for tasks assessed   Balance    End of Session PT - End of Session Equipment Utilized During Treatment: Gait belt Activity Tolerance: Patient tolerated treatment well Patient left: in chair;with call bell/phone within reach;with family/visitor present Nurse Communication: Mobility status  GP     Leilani Cespedes 04/12/2012, 12:36 PM

## 2012-04-12 NOTE — Progress Notes (Signed)
Physical Therapy Treatment Patient Details Name: Antonio Berg MRN: [id number] DOB: 1955-08-05 Today's Date: 04/12/2012 Time: 6962-9528 PT Time Calculation (min): 19 min  PT Assessment / Plan / Recommendation Comments on Treatment Session       Follow Up Recommendations  SNF     Does the patient have the potential to tolerate intense rehabilitation     Barriers to Discharge        Equipment Recommendations  Rolling walker with 5" wheels    Recommendations for Other Services OT consult  Frequency 7X/week   Plan Discharge plan remains appropriate    Precautions / Restrictions Precautions Precautions: Posterior Hip;Fall Precaution Comments: Sign hung in room Restrictions Weight Bearing Restrictions: Yes RLE Weight Bearing: Partial weight bearing RLE Partial Weight Bearing Percentage or Pounds: 50%   Pertinent Vitals/Pain     Mobility  Bed Mobility Bed Mobility: Sit to Supine Supine to Sit: 1: +2 Total assist Supine to Sit: Patient Percentage: 50% Sit to Supine: 3: Mod assist Details for Bed Mobility Assistance: cues for sequence, THP and use of L LE to self assist.  Physical assist to manage R LE and to bring trunk to upright Transfers Transfers: Sit to Stand;Stand to Sit Sit to Stand: 1: +2 Total assist Sit to Stand: Patient Percentage: 60% Stand to Sit: 1: +2 Total assist Stand to Sit: Patient Percentage: 70% Details for Transfer Assistance: cues for LE management, adherence to THP and use of UEs to self assist Ambulation/Gait Ambulation/Gait Assistance: 1: +2 Total assist Ambulation/Gait: Patient Percentage: 80% Ambulation Distance (Feet): 58 Feet Assistive device: Rolling walker Ambulation/Gait Assistance Details: cues for posture, position from RW and stride length and PWB Gait Pattern: Step-to pattern    Exercises Total Joint Exercises Ankle Circles/Pumps: AROM;Both;15 reps;Supine Quad Sets: AROM;Both;10 reps;Supine Heel Slides: AAROM;10  reps;Supine;Right Hip ABduction/ADduction: AAROM;10 reps;Supine;Right   PT Diagnosis: Difficulty walking  PT Problem List: Decreased strength;Decreased range of motion;Decreased activity tolerance;Decreased mobility;Decreased knowledge of use of DME;Obesity;Pain;Decreased knowledge of precautions PT Treatment Interventions: DME instruction;Gait training;Stair training;Functional mobility training;Therapeutic activities;Therapeutic exercise;Patient/family education   PT Goals Acute Rehab PT Goals PT Goal Formulation: With patient Time For Goal Achievement: 04/17/12 Potential to Achieve Goals: Good Pt will go Supine/Side to Sit: with supervision PT Goal: Supine/Side to Sit - Progress: Goal set today Pt will go Sit to Supine/Side: with supervision PT Goal: Sit to Supine/Side - Progress: Goal set today Pt will go Sit to Stand: with supervision PT Goal: Sit to Stand - Progress: Goal set today Pt will go Stand to Sit: with supervision PT Goal: Stand to Sit - Progress: Goal set today Pt will Ambulate: 51 - 150 feet;with supervision;with rolling walker PT Goal: Ambulate - Progress: Goal set today  Visit Information  Last PT Received On: 04/12/12 Assistance Needed: +2    Subjective Data  Subjective: I know I have to do this to get better Patient Stated Goal: Get back to work ad the [employer]  Cognition Overall Cognitive Status: Appears within functional limits for tasks assessed/performed Arousal/Alertness: Awake/alert Orientation Level: Appears intact for tasks assessed Behavior During Session: [facility] for tasks performed    Balance     End of Session PT - End of Session Equipment Utilized During Treatment: Gait belt Activity Tolerance: Patient tolerated treatment well Patient left: with call bell/phone within reach;with family/visitor present;in bed Nurse Communication: Mobility status   GP     Rani Idler 04/12/2012, 3:09 PM

## 2012-04-12 NOTE — Op Note (Signed)
NAMETALLON, Antonio NO.:  [id number]  MEDICAL RECORD NO.:  [id number]  LOCATION:  1609                         FACILITY:  [facility]  PHYSICIAN:  Georges Lynch. Anna Beaird, M.D.DATE OF BIRTH:  Aug 09, 1955  DATE OF PROCEDURE:  04/11/2012 DATE OF DISCHARGE:                              OPERATIVE REPORT   SURGEON:  Georges Lynch. Darrelyn Hillock, MD  ASSISTANT:  Madlyn Frankel. Charlann Boxer, MD and Lanney Gins, PA  PREOPERATIVE DIAGNOSES: 1. 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 2. Morbid obesity.  Note, this was a complex total hip.  POSTOPERATIVE DIAGNOSES: 1. 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 2. Morbid obesity.  Note, this was a complex total hip.  OPERATION:  Right total hip arthroplasty utilizing the DePuy system.  I used a high offset Tri-Lock stem, size 7 femoral component.  The ball size was 36 mm diameter ball, +5, the cup was a pinnacle cup, size 58 with 1 screw for fixation.  The insert was a inside 36 mm diameter AltrX cup.  PROCEDURE:  Under general anesthesia with the patient on left side and right side up, routine orthopedic prep and draping of the right lower extremity was carried out.  Note, he had 3 g of IV Ancef.  Prior to surgery, I did the appropriate time-out.  I also marked the appropriate right lower extremity in the holding area.  At this time, the posterior lateral approach to hip was carried out.  Bleeders were identified and cauterized.  I then went down and inserted self-retaining retractors. Note, this was a wound that was extremely deep because of his obesity. We took our time going down to inside the iliotibial band, partially detached the external rotators and up did a capsulectomy, dislocated the hip, and amputated the femoral head at the appropriate neck length. Following this, I utilized the box osteotome to remove the cancellous bone from the trochanter.  I then utilized the widening reamer and then a canal  finder.  I thoroughly irrigated out the canal.  I rasped the canal up to a size 7 Tri-Lock stem.  Following that, I irrigated the canal, packed the canal open, and then paid attention to the acetabulum. I completed the capsulectomy.  I then thoroughly curetted out the bone cyst.  We then reamed the acetabulum up to a size 57 for a 58 mm cup. Prior to inserting the cup, we packed the bone cysts in the acetabulum with his own bone.  Following that, we then inserted our permanent 58 mm cup with 1 screw for fixation.  We utilized a Charnley guide to make sure we had the appropriate anteversion and abduction.  After the screw was inserted, we inserted the hole eliminator and then the AltrX cup. We then took the hip through motion with the trial components in and selected the above mentioned.  I then inserted my permanent size 7 Tri- Lock stem high offset with a 36 mm diameter +5 head.  With the ceramic head, we reduced the hip, had excellent motion and excellent leg lengths that were checked during the entire procedure as well.  We thoroughly irrigated out the hip and inserted Hemovac drain and then injected  a mixture of 20 mL of Exparel with 20 mL of normal saline.  The wound was closed in layers over a Hemovac drain.  Skin was closed with metal staples and a sterile dressing was applied.  The patient left the operating room in satisfactory condition.          ______________________________ Georges Lynch Darrelyn Hillock, M.D.     RAG/MEDQ  D:  04/11/2012  T:  04/12/2012  Job:  [id number]

## 2012-04-12 NOTE — Progress Notes (Signed)
04/12/12  0600/0630 Nursing On call pa called reg K 2.9 this am. Pt on dyazide and taking k 20 meq bid. No answers to two pages.

## 2012-04-13 LAB — CBC
MCHC: 34 g/dL (ref 30.0–36.0)
MCV: 87.4 fL (ref 78.0–100.0)
Platelets: 212 10*3/uL (ref 150–400)
RDW: 13.5 % (ref 11.5–15.5)
WBC: 13.9 10*3/uL — ABNORMAL HIGH (ref 4.0–10.5)

## 2012-04-13 LAB — BASIC METABOLIC PANEL
Chloride: 100 mEq/L (ref 96–112)
Creatinine, Ser: 0.73 mg/dL (ref 0.50–1.35)
GFR calc Af Amer: >90 mL/min (ref >90)
GFR calc non Af Amer: >90 mL/min (ref >90)
Potassium: 3.3 mEq/L — ABNORMAL LOW (ref 3.5–5.1)

## 2012-04-13 MED ORDER — HYDROMORPHONE HCL PF 1 MG/ML IJ SOLN
0.5000 mg | INTRAMUSCULAR | Status: DC | PRN
  Administered 2012-04-13: 0.5 mg via INTRAVENOUS
  Administered 2012-04-13: 1 mg via INTRAVENOUS
  Administered 2012-04-13: 0.5 mg via INTRAVENOUS
  Administered 2012-04-15 – 2012-04-16 (×3): 1 mg via INTRAVENOUS
  Filled 2012-04-13 (×5): qty 1

## 2012-04-13 NOTE — Progress Notes (Signed)
Physical Therapy Treatment Patient Details Name: Antonio Berg MRN: [id number] DOB: 1955/04/06 Today's Date: 04/13/2012 Time: 4098-1191 PT Time Calculation (min): 30 min  PT Assessment / Plan / Recommendation Comments on Treatment Session       Follow Up Recommendations  SNF     Does the patient have the potential to tolerate intense rehabilitation     Barriers to Discharge        Equipment Recommendations  Rolling walker with 5" wheels    Recommendations for Other Services OT consult  Frequency 7X/week   Plan Discharge plan remains appropriate    Precautions / Restrictions Precautions Precautions: Posterior Hip;Fall Precaution Comments: Pt recalls all THP without cues Restrictions Weight Bearing Restrictions: Yes RLE Weight Bearing: Partial weight bearing RLE Partial Weight Bearing Percentage or Pounds: 50%   Pertinent Vitals/Pain 8/10; premed, ice packs provided    Mobility  Bed Mobility Bed Mobility: Sit to Supine Sit to Supine: 3: Mod assist Details for Bed Mobility Assistance: cues for sequence, THP and use of L LE to self assist.  Physical assist to manage R LE and to bring trunk to upright Transfers Transfers: Sit to Stand;Stand to Sit Sit to Stand: 1: +2 Total assist;With upper extremity assist;With armrests;From chair/3-in-1 Sit to Stand: Patient Percentage: 60% Stand to Sit: 1: +2 Total assist;With upper extremity assist;To bed;To elevated surface Stand to Sit: Patient Percentage: 70% Details for Transfer Assistance: cues for LE management, adherence to THP and use of UEs to self assist Ambulation/Gait Ambulation/Gait Assistance: 1: +2 Total assist Ambulation/Gait: Patient Percentage: 80% Ambulation Distance (Feet): 59 Feet Assistive device: Rolling walker Ambulation/Gait Assistance Details: cues for posture, position from RW and ER on R Gait Pattern: Step-to pattern Gait velocity: slow General Gait Details: Ltd by increasing pain    Exercises      PT Diagnosis:    PT Problem List:   PT Treatment Interventions:     PT Goals Acute Rehab PT Goals PT Goal Formulation: With patient Time For Goal Achievement: 04/17/12 Potential to Achieve Goals: Good Pt will go Supine/Side to Sit: with supervision PT Goal: Supine/Side to Sit - Progress: Progressing toward goal Pt will go Sit to Supine/Side: with supervision PT Goal: Sit to Supine/Side - Progress: Progressing toward goal Pt will go Sit to Stand: with supervision PT Goal: Sit to Stand - Progress: Progressing toward goal Pt will go Stand to Sit: with supervision PT Goal: Stand to Sit - Progress: Progressing toward goal Pt will Ambulate: 51 - 150 feet;with supervision;with rolling walker PT Goal: Ambulate - Progress: Progressing toward goal  Visit Information  Last PT Received On: 04/13/12 Assistance Needed: +2    Subjective Data  Subjective: I'm going to do this, I'm not giving up Patient Stated Goal: Get back to work at the [employer] Overall Cognitive Status: Appears within functional limits for tasks assessed/performed Arousal/Alertness: Awake/alert Orientation Level: Appears intact for tasks assessed Behavior During Session: [facility] for tasks performed    Balance     End of Session PT - End of Session Equipment Utilized During Treatment: Gait belt Activity Tolerance: Patient tolerated treatment well Patient left: with call bell/phone within reach;with family/visitor present;in bed Nurse Communication: Mobility status   GP     Antonio Berg 04/13/2012, 1:08 PM

## 2012-04-13 NOTE — Progress Notes (Signed)
Physical Therapy Treatment Patient Details Name: Antonio Berg MRN: [id number] DOB: 1955-03-22 Today's Date: 04/13/2012 Time: 4098-1191 PT Time Calculation (min): 36 min  PT Assessment / Plan / Recommendation Comments on Treatment Session       Follow Up Recommendations  SNF     Does the patient have the potential to tolerate intense rehabilitation     Barriers to Discharge        Equipment Recommendations  Rolling walker with 5" wheels    Recommendations for Other Services OT consult  Frequency 7X/week   Plan Discharge plan remains appropriate    Precautions / Restrictions Precautions Precautions: Posterior Hip;Fall Precaution Comments: Pt recalls all THP without cues Restrictions Weight Bearing Restrictions: Yes RLE Weight Bearing: Partial weight bearing RLE Partial Weight Bearing Percentage or Pounds: 50%   Pertinent Vitals/Pain 10/10 with activity, pt premed, RN aware, ice packs provided    Mobility  Bed Mobility Bed Mobility: Supine to Sit Supine to Sit: 3: Mod assist;4: Min assist Details for Bed Mobility Assistance: cues for sequence, THP and use of L LE to self assist.  Physical assist to manage R LE and to bring trunk to upright Transfers Transfers: Sit to Stand;Stand to Sit Sit to Stand: 1: +2 Total assist;From elevated surface;From bed;With upper extremity assist Sit to Stand: Patient Percentage: 70% Stand to Sit: 1: +2 Total assist;With armrests;To chair/3-in-1;With upper extremity assist Stand to Sit: Patient Percentage: 70% Details for Transfer Assistance: cues for LE management, adherence to THP and use of UEs to self assist Ambulation/Gait Ambulation/Gait Assistance: 1: +2 Total assist Ambulation/Gait: Patient Percentage: 80% Ambulation Distance (Feet): 31 Feet Assistive device: Rolling walker Ambulation/Gait Assistance Details: min cues for posture, sequence and position from RW Gait Pattern: Step-to pattern General Gait Details: Ltd by increasing  pain    Exercises Total Joint Exercises Ankle Circles/Pumps: AROM;Both;15 reps;Supine Quad Sets: AROM;Both;10 reps;Supine Gluteal Sets: AROM;10 reps;Both;Supine Heel Slides: AAROM;Supine;Right;15 reps Hip ABduction/ADduction: AAROM;Supine;Right;15 reps   PT Diagnosis:    PT Problem List:   PT Treatment Interventions:     PT Goals Acute Rehab PT Goals PT Goal Formulation: With patient Time For Goal Achievement: 04/17/12 Potential to Achieve Goals: Good Pt will go Supine/Side to Sit: with supervision PT Goal: Supine/Side to Sit - Progress: Progressing toward goal Pt will go Sit to Supine/Side: with supervision PT Goal: Sit to Supine/Side - Progress: Progressing toward goal Pt will go Sit to Stand: with supervision PT Goal: Sit to Stand - Progress: Progressing toward goal Pt will go Stand to Sit: with supervision PT Goal: Stand to Sit - Progress: Progressing toward goal Pt will Ambulate: 51 - 150 feet;with supervision;with rolling walker PT Goal: Ambulate - Progress: Progressing toward goal  Visit Information  Last PT Received On: 04/13/12 Assistance Needed: +2    Subjective Data  Subjective: I had a rough night but lets do what we need to Patient Stated Goal: Get back to work ad the [employer]  Cognition Overall Cognitive Status: Appears within functional limits for tasks assessed/performed Arousal/Alertness: Awake/alert Orientation Level: Appears intact for tasks assessed Behavior During Session: [facility] for tasks performed    Balance     End of Session PT - End of Session Equipment Utilized During Treatment: Gait belt Activity Tolerance: Patient tolerated treatment well Patient left: with call bell/phone within reach;with family/visitor present;in bed Nurse Communication: Mobility status   GP     Antonio Berg 04/13/2012, 11:49 AM

## 2012-04-13 NOTE — Progress Notes (Signed)
PA, [facility] notified patient with temperature of 102.3.  Patient diaphoretic.  States pain at a 7.  Discussed patient pain medications. Ordered to continue to follow patient temperatures and use prescribed pain medications

## 2012-04-13 NOTE — Progress Notes (Signed)
Patient c/o ongoing severe pain to surgical rt hip without relief from po pain meds. Christene Lye PA return page. T.O.V. For Dilaudid 0.5-1mg  IV every 2 hours prn severe pain. Continue sheduled po Dilaudid. This nurse will inform patient of med orders.

## 2012-04-13 NOTE — Progress Notes (Signed)
Antonio Berg  MRN: [id number] DOB/Age: 59-24-1957 59 y.o. Physician: Jacquelyne Balint Procedure: Procedure(s) (LRB): TOTAL HIP ARTHROPLASTY (Right)     Subjective: Pain as expected. Ambulated twice in hallway yesterday. Eating, voiding and passing flatus  Vital Signs Temp:  [98.4 F (36.9 C)-100.8 F (38.2 C)] 100.1 F (37.8 C) (03/29 0500) Pulse Rate:  [66-98] 98 (03/29 0500) Resp:  [16] 16 (03/29 0500) BP: (109-118)/(56-79) 118/79 mmHg (03/29 0500) SpO2:  [94 %-98 %] 94 % (03/29 0500)  Lab Results  Recent Labs  04/12/12 0430 04/13/12 0510  WBC 11.6* 13.9*  HGB 10.2* 9.7*  HCT 29.7* 28.5*  PLT 225 212   BMET  Recent Labs  04/12/12 0430 04/13/12 0510  NA 136 134*  K 2.9* 3.3*  CL 99 100  CO2 26 29  GLUCOSE 137* 91  BUN 16 16  CREATININE 0.83 0.73  CALCIUM 8.4 8.3*   INR  Date Value Range Status  04/03/2012 1.00  0.00 - 1.49 Final     Exam Hip incision with old dry blood. Staples intact with no active bleeding. NVI RLE        Plan S/p R THA Original plan is for DC to [facility] however he did quite well with PT Will reevaluate as progresses. DC IV and cont PT/OT  Dody Smartt for Dr.Kevin Supple 04/13/2012, 8:44 AM

## 2012-04-13 NOTE — Progress Notes (Signed)
OT Cancellation Note  Patient Details Name: Antonio Berg MRN: [id number] DOB: [birth year]   Cancelled Treatment:    Reason Eval/Treat Not Completed: Fatigue/lethargy limiting ability to participate. Pt had just completed PT and returned to bed.  Will continue to follow.  Evern Bio 04/13/2012, 2:03 PM [phone]

## 2012-04-14 ENCOUNTER — Inpatient Hospital Stay (HOSPITAL_COMMUNITY): Payer: BC Managed Care – PPO

## 2012-04-14 LAB — DIFFERENTIAL
Basophils Absolute: 0 10*3/uL (ref 0.0–0.1)
Basophils Relative: 0 % (ref 0–1)
Lymphocytes Relative: 14 % (ref 12–46)
Monocytes Absolute: 2.3 10*3/uL — ABNORMAL HIGH (ref 0.1–1.0)
Neutro Abs: 11.6 10*3/uL — ABNORMAL HIGH (ref 1.7–7.7)
Neutrophils Relative %: 71 % (ref 43–77)

## 2012-04-14 LAB — CBC
HCT: 28 % — ABNORMAL LOW (ref 39.0–52.0)
MCHC: 34.6 g/dL (ref 30.0–36.0)
RDW: 13.5 % (ref 11.5–15.5)
WBC: 15.6 10*3/uL — ABNORMAL HIGH (ref 4.0–10.5)

## 2012-04-14 MED ORDER — TRIAMTERENE-HCTZ 37.5-25 MG PO TABS
1.0000 | ORAL_TABLET | Freq: Two times a day (BID) | ORAL | Status: DC
  Administered 2012-04-14 – 2012-04-17 (×6): 1 via ORAL
  Filled 2012-04-14 (×9): qty 1

## 2012-04-14 NOTE — Progress Notes (Signed)
Subjective: 3 Days Post-Op Procedure(s) (LRB): TOTAL HIP ARTHROPLASTY (Right) Patient reports pain as 3 on 0-10 scale.  Now afebrile. Will watch closely since  he had a temp. And elevated WBC. Dressing looks fine. Will repeat WBC.  Objective: Vital signs in last 24 hours: Temp:  [98.1 F (36.7 C)-102.3 F (39.1 C)] 98.1 F (36.7 C) (03/30 0524) Pulse Rate:  [96-110] 96 (03/30 0524) Resp:  [16-19] 16 (03/30 0524) BP: (107-149)/(59-96) 107/69 mmHg (03/30 0524) SpO2:  [94 %-100 %] 100 % (03/30 0524)  Intake/Output from previous day: 03/29 0701 - 03/30 0700 In: 560 [P.O.:560] Out: 2150 [Urine:2150] Intake/Output this shift:     Recent Labs  04/12/12 0430 04/13/12 0510 04/14/12 0427  HGB 10.2* 9.7* 9.7*    Recent Labs  04/13/12 0510 04/14/12 0427  WBC 13.9* 15.6*  RBC 3.26* 3.22*  HCT 28.5* 28.0*  PLT 212 201    Recent Labs  04/12/12 0430 04/13/12 0510  NA 136 134*  K 2.9* 3.3*  CL 99 100  CO2 26 29  BUN 16 16  CREATININE 0.83 0.73  GLUCOSE 137* 91  CALCIUM 8.4 8.3*   No results found for this basename: LABPT, INR,  in the last 72 hours  Dorsiflexion/Plantar flexion intact  Assessment/Plan: 3 Days Post-Op Procedure(s) (LRB): TOTAL HIP ARTHROPLASTY (Right) Up with therapy. SNF Tomorrow.  Lucillie Kiesel A 04/14/2012, 7:42 AM

## 2012-04-14 NOTE — Progress Notes (Signed)
Physical Therapy Treatment Patient Details Name: Antonio Berg MRN: [id number] DOB: [birth year] Today's Date: 04/14/2012 Time: 4098-1191 PT Time Calculation (min): 36 min  PT Assessment / Plan / Recommendation Comments on Treatment Session       Follow Up Recommendations  SNF     Does the patient have the potential to tolerate intense rehabilitation     Barriers to Discharge        Equipment Recommendations  Rolling walker with 5" wheels    Recommendations for Other Services OT consult  Frequency 7X/week   Plan Discharge plan remains appropriate    Precautions / Restrictions Precautions Precautions: Posterior Hip;Fall Precaution Comments: Pt recalls 2/3 THP without cues Restrictions Weight Bearing Restrictions: Yes RLE Weight Bearing: Partial weight bearing RLE Partial Weight Bearing Percentage or Pounds: 50%   Pertinent Vitals/Pain 8/10; premed, RN aware, ice packs provided    Mobility  Bed Mobility Bed Mobility: Sit to Supine Sit to Supine: 4: Min assist;3: Mod assist Details for Bed Mobility Assistance: cues for sequence, THP and use of L LE to self assist. Transfers Transfers: Sit to Stand;Stand to Sit Sit to Stand: 1: +2 Total assist;With armrests;From chair/3-in-1;With upper extremity assist Sit to Stand: Patient Percentage: 60% Stand to Sit: 4: Min assist;3: Mod assist Details for Transfer Assistance: cues for LE management, adherence to THP and use of UEs to self assist Ambulation/Gait Ambulation/Gait Assistance: 4: Min assist Ambulation Distance (Feet): 100 Feet (and 5' ) Assistive device: Rolling walker Ambulation/Gait Assistance Details: cues for posture and position from RW with stepping backward Gait Pattern: Step-to pattern Gait velocity: slow    Exercises     PT Diagnosis:    PT Problem List:   PT Treatment Interventions:     PT Goals Acute Rehab PT Goals PT Goal Formulation: With patient Time For Goal Achievement: 04/17/12 Potential to  Achieve Goals: Good Pt will go Supine/Side to Sit: with supervision PT Goal: Supine/Side to Sit - Progress: Progressing toward goal Pt will go Sit to Supine/Side: with supervision PT Goal: Sit to Supine/Side - Progress: Progressing toward goal Pt will go Sit to Stand: with supervision PT Goal: Sit to Stand - Progress: Progressing toward goal Pt will go Stand to Sit: with supervision PT Goal: Stand to Sit - Progress: Progressing toward goal Pt will Ambulate: 51 - 150 feet;with supervision;with rolling walker PT Goal: Ambulate - Progress: Progressing toward goal  Visit Information  Last PT Received On: 04/14/12 Assistance Needed: +2    Subjective Data  Subjective: I wanted to go as far as this morning Patient Stated Goal: Get back to work at the [employer] Overall Cognitive Status: Appears within functional limits for tasks assessed/performed Arousal/Alertness: Awake/alert Orientation Level: Appears intact for tasks assessed Behavior During Session: [facility] for tasks performed    Balance     End of Session PT - End of Session Equipment Utilized During Treatment: Gait belt Activity Tolerance: Patient tolerated treatment well;Patient limited by pain Patient left: with call bell/phone within reach;with family/visitor present;in bed Nurse Communication: Mobility status   GP     Trina Asch 04/14/2012, 2:42 PM

## 2012-04-14 NOTE — Progress Notes (Signed)
Physical Therapy Treatment Patient Details Name: Antonio Berg MRN: [id number] DOB: 07-26-1955 Today's Date: 04/14/2012 Time: 0902-0940 PT Time Calculation (min): 38 min  PT Assessment / Plan / Recommendation Comments on Treatment Session       Follow Up Recommendations  SNF     Does the patient have the potential to tolerate intense rehabilitation     Barriers to Discharge        Equipment Recommendations  Rolling walker with 5" wheels    Recommendations for Other Services OT consult  Frequency 7X/week   Plan Discharge plan remains appropriate    Precautions / Restrictions Precautions Precautions: Posterior Hip;Fall Precaution Comments: Pt recalls all THP without cues Restrictions Weight Bearing Restrictions: Yes RLE Weight Bearing: Partial weight bearing RLE Partial Weight Bearing Percentage or Pounds: 50%   Pertinent Vitals/Pain 7/10; premed, ice packs provided    Mobility  Bed Mobility Bed Mobility: Supine to Sit Supine to Sit: 3: Mod assist;4: Min assist Details for Bed Mobility Assistance: cues for sequence, THP and use of L LE to self assist.  Physical assist to manage R LE and to complete move to EOB Transfers Transfers: Sit to Stand;Stand to Sit Sit to Stand: From elevated surface;3: Mod assist;With upper extremity assist;From bed Stand to Sit: 4: Min assist;3: Mod assist;To chair/3-in-1;With armrests;With upper extremity assist Details for Transfer Assistance: cues for LE management, adherence to THP and use of UEs to self assist Ambulation/Gait Ambulation/Gait Assistance: 4: Min assist Ambulation Distance (Feet): 101 Feet Assistive device: Rolling walker Ambulation/Gait Assistance Details: cues for posture, ER on R and position from RW Gait Pattern: Step-to pattern Gait velocity: slow    Exercises Total Joint Exercises Ankle Circles/Pumps: AROM;Both;15 reps;Supine Quad Sets: AROM;Both;10 reps;Supine Gluteal Sets: AROM;10 reps;Both;Supine Heel Slides:  AAROM;Supine;Right;20 reps Hip ABduction/ADduction: AAROM;Supine;Right;20 reps   PT Diagnosis:    PT Problem List:   PT Treatment Interventions:     PT Goals Acute Rehab PT Goals PT Goal Formulation: With patient Time For Goal Achievement: 04/17/12 Potential to Achieve Goals: Good Pt will go Supine/Side to Sit: with supervision PT Goal: Supine/Side to Sit - Progress: Progressing toward goal Pt will go Sit to Supine/Side: with supervision PT Goal: Sit to Supine/Side - Progress: Progressing toward goal Pt will go Sit to Stand: with supervision PT Goal: Sit to Stand - Progress: Progressing toward goal Pt will go Stand to Sit: with supervision PT Goal: Stand to Sit - Progress: Progressing toward goal Pt will Ambulate: 51 - 150 feet;with supervision;with rolling walker PT Goal: Ambulate - Progress: Progressing toward goal  Visit Information  Last PT Received On: 04/14/12 Assistance Needed: +1    Subjective Data  Subjective: You have to push it if you are going to get better Patient Stated Goal: Get back to work at the [employer] Overall Cognitive Status: Appears within functional limits for tasks assessed/performed Arousal/Alertness: Awake/alert Orientation Level: Appears intact for tasks assessed Behavior During Session: [facility] for tasks performed    Balance     End of Session PT - End of Session Equipment Utilized During Treatment: Gait belt Activity Tolerance: Patient tolerated treatment well Patient left: with call bell/phone within reach;with family/visitor present;in chair Nurse Communication: Mobility status   GP     Antonio Berg 04/14/2012, 9:41 AM

## 2012-04-14 NOTE — Progress Notes (Signed)
Pt with fever of 102.8. A.Constable, PA, notified & orders received. Janney Priego, [employer]

## 2012-04-15 ENCOUNTER — Ambulatory Visit (HOSPITAL_COMMUNITY): Payer: BC Managed Care – PPO

## 2012-04-15 DIAGNOSIS — K5732 Diverticulitis of large intestine without perforation or abscess without bleeding: Secondary | ICD-10-CM

## 2012-04-15 DIAGNOSIS — K5792 Diverticulitis of intestine, part unspecified, without perforation or abscess without bleeding: Secondary | ICD-10-CM | POA: Diagnosis not present

## 2012-04-15 LAB — URINE CULTURE
Colony Count: 30000
Special Requests: NORMAL

## 2012-04-15 LAB — CBC WITH DIFFERENTIAL/PLATELET
Basophils Relative: 0 % (ref 0–1)
Eosinophils Absolute: 0.1 10*3/uL (ref 0.0–0.7)
Eosinophils Relative: 0 % (ref 0–5)
Lymphs Abs: 2.2 10*3/uL (ref 0.7–4.0)
MCH: 29.9 pg (ref 26.0–34.0)
MCHC: 34.9 g/dL (ref 30.0–36.0)
MCV: 85.8 fL (ref 78.0–100.0)
Neutrophils Relative %: 76 % (ref 43–77)
Platelets: 229 10*3/uL (ref 150–400)

## 2012-04-15 LAB — HIV ANTIBODY (ROUTINE TESTING W REFLEX): HIV: NONREACTIVE

## 2012-04-15 LAB — HEPATITIS C ANTIBODY: HCV Ab: NEGATIVE

## 2012-04-15 MED ORDER — ENOXAPARIN SODIUM 40 MG/0.4ML ~~LOC~~ SOLN: 40.0000 mg | SUBCUTANEOUS | Status: DC

## 2012-04-15 MED ORDER — PIPERACILLIN-TAZOBACTAM 3.375 G IVPB
3.3750 g | Freq: Three times a day (TID) | INTRAVENOUS | Status: DC
  Administered 2012-04-15 – 2012-04-17 (×7): 3.375 g via INTRAVENOUS
  Filled 2012-04-15 (×8): qty 50

## 2012-04-15 MED ORDER — IOHEXOL 300 MG/ML  SOLN
100.0000 mL | Freq: Once | INTRAMUSCULAR | Status: AC | PRN
  Administered 2012-04-15: 100 mL via INTRAVENOUS

## 2012-04-15 MED ORDER — ENOXAPARIN SODIUM 40 MG/0.4ML ~~LOC~~ SOLN
40.0000 mg | SUBCUTANEOUS | Status: DC
  Administered 2012-04-16: 40 mg via SUBCUTANEOUS
  Filled 2012-04-15 (×2): qty 0.4

## 2012-04-15 MED ORDER — IOHEXOL 300 MG/ML  SOLN
25.0000 mL | INTRAMUSCULAR | Status: AC
  Administered 2012-04-15 (×2): 25 mL via ORAL

## 2012-04-15 MED ORDER — KCL IN DEXTROSE-NACL 40-5-0.9 MEQ/L-%-% IV SOLN
INTRAVENOUS | Status: DC
  Administered 2012-04-15 – 2012-04-18 (×3): via INTRAVENOUS
  Filled 2012-04-15 (×8): qty 1000

## 2012-04-15 NOTE — Progress Notes (Signed)
Subjective: 4 Days Post-Op Procedure(s) (LRB): TOTAL HIP ARTHROPLASTY (Right) Patient reports pain as 4 on 0-10 scale. Now afebrile but has had Temp of 102 yesterday.His WBC has been followed closely and has been increasing. Today WBC is 18.4. I consulted with ID yesterday and we both agreed to hold off on antibiotics until we have established a Diagnosis. His wound looks excellent. He has some mild Lower quadrant pain bilaterally,but is passing gas and does not have signs of an acute abdomen. Will get CT Abdomen  Today.Chest Xray was normal. Awaiting Urine and Blood Cultures that were taken when he was febrile. Also will call ID to evaluate today.  Objective: Vital signs in last 24 hours: Temp:  [98.4 F (36.9 C)-102.8 F (39.3 C)] 98.6 F (37 C) (03/31 0450) Pulse Rate:  [92-112] 92 (03/31 0450) Resp:  [16-20] 20 (03/31 0450) BP: (105-129)/(66-82) 105/68 mmHg (03/31 0450) SpO2:  [93 %-100 %] 98 % (03/31 0450)  Intake/Output from previous day: 03/30 0701 - 03/31 0700 In: 240 [P.O.:240] Out: 1300 [Urine:1300] Intake/Output this shift:     Recent Labs  04/13/12 0510 04/14/12 0427 04/15/12 0413  HGB 9.7* 9.7* 9.7*    Recent Labs  04/14/12 0427 04/15/12 0413  WBC 15.6* 18.4*  RBC 3.22* 3.24*  HCT 28.0* 27.8*  PLT 201 229    Recent Labs  04/13/12 0510  NA 134*  K 3.3*  CL 100  CO2 29  BUN 16  CREATININE 0.73  GLUCOSE 91  CALCIUM 8.3*   No results found for this basename: LABPT, INR,  in the last 72 hours  Neurovascular intact No cellulitis present  Assessment/Plan: 4 Days Post-Op Procedure(s) (LRB): TOTAL HIP ARTHROPLASTY (Right) Up with therapy  Antonio Berg A 04/15/2012, 7:22 AM

## 2012-04-15 NOTE — Consult Note (Signed)
Reason for Consult:Diverticultitis with perforation and small abscess. Referring Physician: Netta Corrigan PCP:  Dr. Daneil Dolin  [place], [place]  Antonio Berg is an 57 y.o. male.  HPI: Patient is a 57 year old male who underwent a right hip arthroplasty on 04/11/12. He did well until Saturday 04/13/12 at which time he started having some abdominal discomfort. This became progressively worse compared with fever and a rising WBC. He had some abdominal tenderness and a CT was obtained today.This shows a 3.7 x 2.4 cm fluid air collection adjacent to diverticular disease in the upper sigmoid colon. He has no prior history of diverticular disease. He has had a total of 3 colonoscopies starting at age 62, all were done in [place], [place].  We were asked to see in consultation for diverticulitis and abscess.  Past Medical History  Diagnosis Date  . Hypertension   . Sleep apnea     not used since 2011 , machine was stolen,    Gout    Body mass index is 37.6    . Arthritis     Past Surgical History  Procedure Laterality Date  . Knee arthroscopy      bilateral   . Total hip arthroplasty Right 04/11/2012    Procedure: TOTAL HIP ARTHROPLASTY;  Surgeon: Jacki Cones, MD;  Location: [facility]S;  Service: Orthopedics;  Laterality: Right;    History reviewed. No pertinent family history.  Social History:  reports that he has never smoked. He has never used smokeless tobacco. He reports that he does not drink alcohol or use illicit drugs. Smoked 2 years, none since age 55.  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   Allergies: No Known Allergies  Medications:  Prior to Admission:  Prescriptions prior to admission  Medication Sig Dispense Refill  . allopurinol (ZYLOPRIM) 100 MG tablet Take 100 mg by mouth 2 (two) times daily.      Marland Kitchen amLODipine (NORVASC) 10 MG tablet Take 10 mg by mouth daily before breakfast.      . HYDROmorphone (DILAUDID) 2 MG  tablet Take 2 mg by mouth 3 (three) times daily.      Marland Kitchen oxyCODONE-acetaminophen (PERCOCET) 10-325 MG per tablet Take 1 tablet by mouth every 6 (six) hours as needed for pain.      Marland Kitchen oxymorphone (OPANA ER) 30 MG 12 hr tablet Take 30 mg by mouth every 12 (twelve) hours.      . potassium chloride SA (K-DUR,KLOR-CON) 20 MEQ tablet Take 20 mEq by mouth 2 (two) times daily.      Marland Kitchen triamterene-hydrochlorothiazide (DYAZIDE) 37.5-25 MG per capsule Take 1 capsule by mouth 2 (two) times daily.       Scheduled: . allopurinol  100 mg Oral BID  . amLODipine  10 mg Oral QAC breakfast  . ferrous sulfate  325 mg Oral TID PC  . HYDROmorphone  2 mg Oral TID  . piperacillin-tazobactam (ZOSYN)  IV  3.375 g Intravenous Q8H  . potassium chloride SA  40 mEq Oral BID  . rivaroxaban  10 mg Oral Q breakfast  . triamterene-hydrochlorothiazide  1 each Oral BID   Continuous: . lactated ringers 100 mL/hr at 04/12/12 0554   XBJ:YNWGNFAOZHYQM, acetaminophen, alum & mag hydroxide-simeth, bisacodyl, HYDROmorphone (DILAUDID) injection, menthol-cetylpyridinium, methocarbamol, ondansetron (ZOFRAN) IV, ondansetron, oxyCODONE, oxyCODONE-acetaminophen, phenol, polyethylene glycol Anti-infectives   Start     Dose/Rate Route Frequency Ordered Stop   04/15/12 1215  piperacillin-tazobactam (ZOSYN) IVPB 3.375 g  3.375 g 12.5 mL/hr over 240 Minutes Intravenous Every 8 hours 04/15/12 1200     04/11/12 1400  ceFAZolin (ANCEF) IVPB 1 g/50 mL premix     1 g 100 mL/hr over 30 Minutes Intravenous Every 6 hours 04/11/12 1130 04/11/12 1957   04/11/12 0802  polymyxin B 500,000 Units, bacitracin 50,000 Units in sodium chloride irrigation 0.9 % 500 mL irrigation  Status:  Discontinued       As needed 04/11/12 0802 04/11/12 0928   04/11/12 0600  ceFAZolin (ANCEF) 3 g in dextrose 5 % 50 mL IVPB    Comments:  Dose increased to 3g per P&T policy for weight >120kg.   3 g 160 mL/hr over 30 Minutes Intravenous On call to O.R. 04/11/12 0506  04/11/12 0736      Results for orders placed during the hospital encounter of 04/11/12 (from the past 48 hour(s))  CBC     Status: Abnormal   Collection Time    04/14/12  4:27 AM      Result Value Range   WBC 15.6 (*) 4.0 - 10.5 K/uL   RBC 3.22 (*) 4.22 - 5.81 MIL/uL   Hemoglobin 9.7 (*) 13.0 - 17.0 g/dL   HCT 16.1 (*) 09.6 - 04.5 %   MCV 87.0  78.0 - 100.0 fL   MCH 30.1  26.0 - 34.0 pg   MCHC 34.6  30.0 - 36.0 g/dL   RDW 40.9  81.1 - 91.4 %   Platelets 201  150 - 400 K/uL  DIFFERENTIAL     Status: Abnormal   Collection Time    04/14/12  4:27 AM      Result Value Range   Neutrophils Relative 71  43 - 77 %   Neutro Abs 11.6 (*) 1.7 - 7.7 K/uL   Lymphocytes Relative 14  12 - 46 %   Lymphs Abs 2.4  0.7 - 4.0 K/uL   Monocytes Relative 14 (*) 3 - 12 %   Monocytes Absolute 2.3 (*) 0.1 - 1.0 K/uL   Eosinophils Relative 1  0 - 5 %   Eosinophils Absolute 0.2  0.0 - 0.7 K/uL   Basophils Relative 0  0 - 1 %   Basophils Absolute 0.0  0.0 - 0.1 K/uL  CULTURE, BLOOD (ROUTINE X 2)     Status: None   Collection Time    04/14/12  3:58 PM      Result Value Range   Specimen Description BLOOD LEFT ARM  5 ML IN [facility] BOTTLE     Special Requests Normal     Culture  Setup Time 04/14/2012 21:30     Culture       Value:        BLOOD CULTURE RECEIVED NO GROWTH TO DATE CULTURE WILL BE HELD FOR 5 DAYS BEFORE ISSUING A FINAL NEGATIVE REPORT   Report Status PENDING    CULTURE, BLOOD (ROUTINE X 2)     Status: None   Collection Time    04/14/12  4:06 PM      Result Value Range   Specimen Description BLOOD LEFT HAND  5 ML IN [facility] BOTTLE     Special Requests Normal     Culture  Setup Time 04/14/2012 21:30     Culture       Value:        BLOOD CULTURE RECEIVED NO GROWTH TO DATE CULTURE WILL BE HELD FOR 5 DAYS BEFORE ISSUING A FINAL NEGATIVE REPORT   Report Status PENDING  CBC WITH DIFFERENTIAL     Status: Abnormal   Collection Time    04/15/12  4:13 AM      Result Value Range   WBC 18.4 (*) 4.0  - 10.5 K/uL   RBC 3.24 (*) 4.22 - 5.81 MIL/uL   Hemoglobin 9.7 (*) 13.0 - 17.0 g/dL   HCT 16.1 (*) 09.6 - 04.5 %   MCV 85.8  78.0 - 100.0 fL   MCH 29.9  26.0 - 34.0 pg   MCHC 34.9  30.0 - 36.0 g/dL   RDW 40.9  81.1 - 91.4 %   Platelets 229  150 - 400 K/uL   Neutrophils Relative 76  43 - 77 %   Neutro Abs 14.0 (*) 1.7 - 7.7 K/uL   Lymphocytes Relative 12  12 - 46 %   Lymphs Abs 2.2  0.7 - 4.0 K/uL   Monocytes Relative 11  3 - 12 %   Monocytes Absolute 2.1 (*) 0.1 - 1.0 K/uL   Eosinophils Relative 0  0 - 5 %   Eosinophils Absolute 0.1  0.0 - 0.7 K/uL   Basophils Relative 0  0 - 1 %   Basophils Absolute 0.0  0.0 - 0.1 K/uL    Ct Abdomen Pelvis W Contrast  04/15/2012  *RADIOLOGY REPORT*  Clinical Data: Abdominal pain and progressive white count and fever.  Status post right total hip arthroplasty.  CT ABDOMEN AND PELVIS WITH CONTRAST  Technique:  Multidetector CT imaging of the abdomen and pelvis was performed following the standard protocol during bolus administration of intravenous contrast.  Contrast: OMNIPAQUE IOHEXOL 300 MG/ML  SOLN  Comparison: None  Findings: The lung bases are clear.  Minimal right basilar atelectasis.  No effusions or pulmonary nodule.  The heart is normal in size.  No pericardial effusion.  The distal esophagus is grossly normal.  The solid abdominal organs are unremarkable.  The gallbladder is normal.  No common bile duct dilatation.  The stomach, duodenum and small bowel are unremarkable.  No small bowel obstruction or inflammation.  There is diverticulitis involving the upper sigmoid colon with associated small adjacent diverticular abscess containing a small amount of fluid and air. This measures approximately 3.7 x 2.4 cm.  There are adjacent small reactive/inflammatory lymph nodes.  Moderate diverticulosis involving the rest of the sigmoid colon and the descending colon. The appendix is normal.  The aorta is normal in caliber.  The major branch vessels are  patent.  No mesenteric or retroperitoneal mass or adenopathy. Small scattered lymph nodes are noted.  The bladder, prostate gland and seminal vesicles are normal.  No pelvic mass or adenopathy.  There is a small amount of air in the bladder but this is likely related to recent catheterization.  No  Expected postoperative changes involving the right hip prosthesis. No complicating features are demonstrated.  The left hip is normal. The pubic symphysis and SI joints are intact.  Moderate SI joint degenerative changes bilaterally with partial fusion superiorly and anteriorly.  Facet degenerative changes are noted the lumbar spine.  IMPRESSION: Acute diverticulitis with a small diverticular abscess.  Findings discussed with Dr. Darrelyn Hillock.   Original Report Authenticated By: Rudie Meyer, M.D.    Dg Chest Port 1 View  04/14/2012  *RADIOLOGY REPORT*  Clinical Data: Fever and recent right hip replacement.  PORTABLE CHEST - 1 VIEW  Comparison: 03/08/2012  Findings: Semi upright view of the chest demonstrates low lung volumes.  No focal chest disease.  Heart size  is grossly normal for technique.  The trachea is midline.  IMPRESSION: Low lung volumes without focal disease.   Original Report Authenticated By: Richarda Overlie, M.D.     Review of Systems  Constitutional: Positive for fever. Negative for chills, weight loss, malaise/fatigue and diaphoresis.  HENT: Negative.   Eyes: Negative.        Wears reading glasses   Respiratory: Negative.        CPAP machine stolen 2 years ago, says he doesn't snore like before so he never got it replaced.  Cardiovascular: Negative for chest pain, palpitations, orthopnea, claudication, leg swelling and PND.  Gastrointestinal: Positive for abdominal pain (Started 3/29, got worse over weekend.). Negative for heartburn, nausea, vomiting, diarrhea, constipation, blood in stool and melena.  Genitourinary: Negative.   Musculoskeletal:       Right hip replaced on Thursday 04/11/12  Skin:  Negative.   Neurological: Negative.  Negative for weakness.  Endo/Heme/Allergies: Negative.   Psychiatric/Behavioral: Negative.    Blood pressure 122/66, pulse 86, temperature 98.3 F (36.8 C), temperature source Oral, resp. rate 16, height 6\' 1"  (1.854 m), weight 285 lb (129.275 kg), SpO2 98.00%. Physical Exam  Constitutional: He is oriented to person, place, and time. He appears well-developed and well-nourished. No distress.  Up in chair with a full tray, fried chicken,but no appetite.  HENT:  Head: Normocephalic and atraumatic.  Nose: Nose normal.  Eyes: Conjunctivae and EOM are normal. Pupils are equal, round, and reactive to light. Right eye exhibits no discharge. Left eye exhibits no discharge. No scleral icterus.  Neck: Normal range of motion. Neck supple. No JVD present. No tracheal deviation present. No thyromegaly present.  Cardiovascular: Normal rate, regular rhythm, normal heart sounds and intact distal pulses.  Exam reveals no gallop.   No murmur heard. Respiratory: Effort normal and breath sounds normal. No respiratory distress. He has no wheezes. He has no rales. He exhibits no tenderness.  GI: Soft. Bowel sounds are normal. He exhibits no distension and no mass. Tenderness: still has some diffuse pain lower abodomen, worst in the left lower quad. There is no rebound and no guarding.  Musculoskeletal: He exhibits no edema and no tenderness.  Lymphadenopathy:    He has no cervical adenopathy.  Neurological: He is alert and oriented to person, place, and time. No cranial nerve deficit.  Skin: Skin is warm and dry. He is not diaphoretic.  Incision right thigh   Psychiatric: He has a normal mood and affect. His behavior is normal. Judgment and thought content normal.    Assessment/Plan: 1. Acute diverticulitis with perforation; 3.7 x 2.4 cm abscess. 2. S/p TOTAL HIP ARTHROPLASTY (Right) with Bone Graft to Acetabulum. 3.Hypertension 4. Reported Sleep Apnea, not on CPAP for  2 years 5.osteoarthritis 6.Gout 7.Hypokalemia 8. On XARELTO   If he needs surgery or intervention he should come off this   Plan:  Bowel rest, clear liquids for now, agree with Zosyn. Hydration, replace K+ recheck labs in AM. I would take him off Xarelto and go to lovenox or heparin for now. ( I talked with Dr. Netta Corrigan, and he is agreeable to this .  I have written the order.)   Dr. Johna Sheriff will review film shortly and make further recommendations as needed.  Will Marlyne Beards [facility] for Dr. Johna Sheriff.       Aryahi Denzler 04/15/2012, 2:32 PM

## 2012-04-15 NOTE — Evaluation (Signed)
Occupational Therapy Evaluation Patient Details Name: Mcarthur Ivins MRN: [id number] DOB: [birth year] Today's Date: 04/15/2012 Time: 1330-1330    OT Assessment / Plan / Recommendation Clinical Impression  Pt presents to OT with decreased I with ADL activity due to hip precautions and WB status. Pt will benefit from skilled OT to increase I with ADL activity and reutrn to PLOF    OT Assessment  Patient needs continued OT Services    Follow Up Recommendations  SNF       Equipment Recommendations  None recommended by OT       Frequency  Min 2X/week    Precautions / Restrictions Precautions Precautions: Posterior Hip;Fall Precaution Comments: Pt recalls all THP without cues Restrictions Weight Bearing Restrictions: Yes RLE Weight Bearing: Partial weight bearing RLE Partial Weight Bearing Percentage or Pounds: 50%       ADL  Lower Body Dressing: Performed;Maximal assistance Where Assessed - Lower Body Dressing: Supported sit to stand Toilet Transfer: Simulated;Moderate assistance;Other (comment) (urinal) Toilet Transfer Method: Sit to stand Toileting - Clothing Manipulation and Hygiene: Performed;Moderate assistance Where Assessed - Toileting Clothing Manipulation and Hygiene: Standing    OT Diagnosis: Generalized weakness;Acute pain  OT Problem List: Decreased strength;Decreased activity tolerance;Decreased knowledge of precautions OT Treatment Interventions: Self-care/ADL training;Patient/family education;DME and/or AE instruction   OT Goals Acute Rehab OT Goals Time For Goal Achievement: 04/29/12        Prior Functioning     Home Living Lives With: Spouse Available Help at Discharge: Family Type of Home: House Home Access: Level entry Home Layout: One level Home Adaptive Equipment: Straight cane Prior Function Level of Independence: Independent Driving: Yes Vocation: Full time employment Communication Communication: No difficulties          Vision/Perception Vision - History Patient Visual Report: No change from baseline   Cognition  Cognition Overall Cognitive Status: Appears within functional limits for tasks assessed/performed Arousal/Alertness: Awake/alert Orientation Level: Appears intact for tasks assessed Behavior During Session: [facility] for tasks performed    Extremity/Trunk Assessment Right Upper Extremity Assessment RUE ROM/Strength/Tone: [facility] for tasks assessed Left Upper Extremity Assessment LUE ROM/Strength/Tone: WFL for tasks assessed Right Lower Extremity Assessment RLE ROM/Strength/Tone: Deficits RLE ROM/Strength/Tone Deficits: Hip strength 2/5 with AAROM to 80 flex and 25 abd Left Lower Extremity Assessment LLE ROM/Strength/Tone: WFL for tasks assessed     Mobility Transfers Transfers: Sit to Stand;Stand to Sit Sit to Stand: 3: Mod assist;From chair/3-in-1;With armrests;With upper extremity assist Stand to Sit: To chair/3-in-1;3: Mod assist;With armrests;With upper extremity assist           End of Session OT - End of Session Activity Tolerance: Patient tolerated treatment well Patient left: in chair;with call bell/phone within reach  GO     Josalin Carneiro, [place] Kung 04/15/2012, 1:35 PM

## 2012-04-15 NOTE — Progress Notes (Signed)
CSW assisting with d/c planning. SNF contacted. They have not heard back from [facility] regarding authorization. Clinicals shave been sent to insurance . Awaiting response. CSW will continue to follow to assist with d/c planning to SNF.  Cori Razor LCSW [phone]

## 2012-04-15 NOTE — Progress Notes (Signed)
Physical Therapy Treatment Patient Details Name: Antonio Berg MRN: [id number] DOB: 19-Mar-1955 Today's Date: 04/15/2012 Time: 1478-2956 PT Time Calculation (min): 27 min  PT Assessment / Plan / Recommendation Comments on Treatment Session  POD # 4 R posterior THR with post op GI issues.  Amb pt in hallway then performed some TE's when tx session was shortened due to arrival of lunch tray. Pt plans to D/C to SNF for ST Rehab.    Follow Up Recommendations  SNF     Does the patient have the potential to tolerate intense rehabilitation     Barriers to Discharge        Equipment Recommendations  Rolling walker with 5" wheels    Recommendations for Other Services    Frequency 7X/week   Plan Discharge plan remains appropriate    Precautions / Restrictions Precautions Precautions: Posterior Hip;Fall Precaution Comments: Pt recalls all THP without cues Restrictions Weight Bearing Restrictions: Yes RLE Weight Bearing: Partial weight bearing RLE Partial Weight Bearing Percentage or Pounds: "This morning the Doctor said I could put alittle more weight on it"   Pertinent Vitals/Pain C/o 3/10 R hip pain "sore" ICE applied    Mobility  Bed Mobility Bed Mobility: Not assessed Details for Bed Mobility Assistance: Pt OOB in recliner Transfers Transfers: Sit to Stand;Stand to Sit Sit to Stand: 4: Min assist;3: Mod assist;From chair/3-in-1 Stand to Sit: 4: Min assist;3: Mod assist;To chair/3-in-1 Details for Transfer Assistance: increased time and < 25% VC's to avoid hip flex > 90' Ambulation/Gait Ambulation/Gait Assistance: 4: Min assist Ambulation Distance (Feet): 185 Feet Assistive device: Rolling walker Ambulation/Gait Assistance Details: <25% VC's on proper sequencing and up right posture.  50% VC's on safety with turns esp to avoid hip Internal rotation R LE. Gait Pattern: Step-to pattern;Decreased stance time - right Gait velocity: decreased    Exercises Total Joint  Exercises Ankle Circles/Pumps: AROM;Both;10 reps;Seated Quad Sets: AROM;Both;10 reps;Seated Gluteal Sets: AROM;Both;10 reps;Seated    PT Goals                                                      progressing    Visit Information  Last PT Received On: 04/15/12 Assistance Needed: +1    Subjective Data      Cognition  Cognition Overall Cognitive Status: Appears within functional limits for tasks assessed/performed Arousal/Alertness: Awake/alert Orientation Level: Appears intact for tasks assessed Behavior During Session: [facility] for tasks performed    Balance   fair  End of Session PT - End of Session Equipment Utilized During Treatment: Gait belt Activity Tolerance: Patient tolerated treatment well;Patient limited by pain Patient left: with call bell/phone within reach;with family/visitor present;in bed   Felecia Shelling  PTA [facility]  Acute  Rehab Pager      [phone]

## 2012-04-15 NOTE — Consult Note (Signed)
Patient interviewed and examined, agree with PA note above. Mild tenderness on exam. Cont CL diet and IV abx, i expect this will resolve without surgery  Mariella Saa MD, FACS  04/15/2012 4:16 PM

## 2012-04-15 NOTE — Care Management Note (Addendum)
    Page 1 of 2   04/18/2012     12:01:23 PM   CARE MANAGEMENT NOTE 04/18/2012  Patient:  Antonio Berg, Antonio Berg   Account Number:  [id number]  Date Initiated:  04/15/2012  Documentation initiated by:  Lorenda Ishihara  Subjective/Objective Assessment:   57 yo male admitted s/p THA, POD # 4 found to have diverticulitis with possible abscess.     Action/Plan:   Plan initially for SNF now able to d/c home with [facility]   Anticipated DC Date:  04/17/2012   Anticipated DC Plan:  HOME W HOME HEALTH SERVICES  In-house referral  Clinical Social Worker      DC Associate Professor  CM consult      [facility] Choice  HOME HEALTH  DURABLE MEDICAL EQUIPMENT   Choice offered to / List presented to:  C-3 Spouse   DME arranged  3-N-1      DME agency  [facility]     HH arranged  HH-2 PT  HH-3 OT      [facility] agency  [facility]   Status of service:  Completed, signed off Medicare Important Message given?   (If response is "NO", the following Medicare IM given date fields will be blank) Date Medicare IM given:   Date Additional Medicare IM given:    Discharge Disposition:  HOME W HOME HEALTH SERVICES  Per UR Regulation:  Reviewed for med. necessity/level of care/duration of stay  If discussed at Long Length of Stay Meetings, dates discussed:    Comments:  04-17-12 Lorenda Ishihara RN CM 1200 Recieved a call from RN stating per PT and patient plan is now for d/c home. Spoke with patient and wife at bedside. Provided a list of HH agencies for choice, chose [facility]. Contacted [facility] to arrange, awaiting final orders.

## 2012-04-15 NOTE — Consult Note (Addendum)
INFECTIOUS DISEASE CONSULT NOTE  Date of Admission:  04/11/2012  Date of Consult:  04/15/2012  Reason for Consult: Fever and abdominal pain Referring Physician: Gioffre  Impression/Recommendation Diverticulitis with small abscess Status post right total hip replacement Hypertension Hyperlipidemia Obstructive sleep apnea Obesity  Would: Continue zosyn General surgery to eval f/u BCx from yesterday Check HIV and hepatitis C as per CDC guidelines  Comment- Zosyn should cover him well for his intra-abdominal process, covering gram-negative rods as well as anaerobes both aerobic and anaerobic. Decision will be up to general surgery as to whether or not his small abscess needs to be drained, whether this needs to be evaluated by interventional radiology, or where this can be just watched and treated medically. We will continue to follow him with you.   Thank you so much for this interesting consult,   Antonio Berg [phone]  Antonio Berg is an 57 y.o. male.  HPI: 57 year old male with a history of morbid obesity, hypertension, hyperlipidemia, and osteoarthritis of his right hip. He was admitted on March 27 and underwent total right hip arthroplasty. He appeared to do well the first 24 hours, was up in a chair in his drain was removed. By March 2090 developed a temperature to 100.8 and his white blood cell count increased to 13.9. On March 30 his temperature had gone to 102.3 and his white blood cell count had increased to 15.6. Today's white blood cell count is 18.4 and temperature was 102.8, he was complaining of left lower quadrant abdominal pain. He is passing gas. He underwent a CT scan of the abdomen today which showed acute diverticulitis with a small diverticular abscess (3.7 x 2.4 cm). He was started on Zosyn.  Past Medical History  Diagnosis Date  . Hypertension   . Sleep apnea     not used since 2011   . Arthritis     Past Surgical History  Procedure Laterality Date    . Knee arthroscopy      bilateral   . Total hip arthroplasty Right 04/11/2012    Procedure: TOTAL HIP ARTHROPLASTY;  Surgeon: Jacki Cones, MD;  Location: [facility] ORS;  Service: Orthopedics;  Laterality: Right;     No Known Allergies  Medications:  Scheduled: . allopurinol  100 mg Oral BID  . amLODipine  10 mg Oral QAC breakfast  . ferrous sulfate  325 mg Oral TID PC  . HYDROmorphone  2 mg Oral TID  . piperacillin-tazobactam (ZOSYN)  IV  3.375 g Intravenous Q8H  . potassium chloride SA  40 mEq Oral BID  . rivaroxaban  10 mg Oral Q breakfast  . triamterene-hydrochlorothiazide  1 each Oral BID    Total days of antibiotics: 0   Social History:  reports that he has never smoked. He has never used smokeless tobacco. He reports that he does not drink alcohol or use illicit drugs.  History reviewed. No pertinent family history. Parents in good health as well as siblings.   General ROS: Constipation. No fevers at home. Normal urination. Good appetite. No change in sensation in his lower extremities. See history of present illness.  Blood pressure 117/64, pulse 92, temperature 98.6 F (37 C), temperature source Oral, resp. rate 20, height 6\' 1"  (1.854 m), weight 129.275 kg (285 lb), SpO2 97.00%. General appearance: alert, cooperative and no distress Eyes: negative findings: pupils equal, round, reactive to light and accomodation Throat: normal findings: oropharynx pink & moist without lesions or evidence of thrush Neck: no adenopathy and supple,  symmetrical, trachea midline Lungs: clear to auscultation bilaterally Heart: regular rate and rhythm Abdomen: normal findings: bowel sounds normal and soft, non-tender and abnormal findings:  distended and obese Extremities: edema None Right hip wound is clean. There are staples present. There is minimal serous discharge present on his dressings. It is nontender there is no evidence of fluctuance. There is no increase in heat.   Results for  orders placed during the hospital encounter of 04/11/12 (from the past 48 hour(s))  CBC     Status: Abnormal   Collection Time    04/14/12  4:27 AM      Result Value Range   WBC 15.6 (*) 4.0 - 10.5 K/uL   RBC 3.22 (*) 4.22 - 5.81 MIL/uL   Hemoglobin 9.7 (*) 13.0 - 17.0 g/dL   HCT 14.7 (*) 82.9 - 56.2 %   MCV 87.0  78.0 - 100.0 fL   MCH 30.1  26.0 - 34.0 pg   MCHC 34.6  30.0 - 36.0 g/dL   RDW 13.0  86.5 - 78.4 %   Platelets 201  150 - 400 K/uL  DIFFERENTIAL     Status: Abnormal   Collection Time    04/14/12  4:27 AM      Result Value Range   Neutrophils Relative 71  43 - 77 %   Neutro Abs 11.6 (*) 1.7 - 7.7 K/uL   Lymphocytes Relative 14  12 - 46 %   Lymphs Abs 2.4  0.7 - 4.0 K/uL   Monocytes Relative 14 (*) 3 - 12 %   Monocytes Absolute 2.3 (*) 0.1 - 1.0 K/uL   Eosinophils Relative 1  0 - 5 %   Eosinophils Absolute 0.2  0.0 - 0.7 K/uL   Basophils Relative 0  0 - 1 %   Basophils Absolute 0.0  0.0 - 0.1 K/uL  CULTURE, BLOOD (ROUTINE X 2)     Status: None   Collection Time    04/14/12  3:58 PM      Result Value Range   Specimen Description BLOOD LEFT ARM  5 ML IN [facility] BOTTLE     Special Requests Normal     Culture  Setup Time 04/14/2012 21:30     Culture       Value:        BLOOD CULTURE RECEIVED NO GROWTH TO DATE CULTURE WILL BE HELD FOR 5 DAYS BEFORE ISSUING A FINAL NEGATIVE REPORT   Report Status PENDING    CULTURE, BLOOD (ROUTINE X 2)     Status: None   Collection Time    04/14/12  4:06 PM      Result Value Range   Specimen Description BLOOD LEFT HAND  5 ML IN [facility] BOTTLE     Special Requests Normal     Culture  Setup Time 04/14/2012 21:30     Culture       Value:        BLOOD CULTURE RECEIVED NO GROWTH TO DATE CULTURE WILL BE HELD FOR 5 DAYS BEFORE ISSUING A FINAL NEGATIVE REPORT   Report Status PENDING    CBC WITH DIFFERENTIAL     Status: Abnormal   Collection Time    04/15/12  4:13 AM      Result Value Range   WBC 18.4 (*) 4.0 - 10.5 K/uL   RBC 3.24 (*) 4.22 -  5.81 MIL/uL   Hemoglobin 9.7 (*) 13.0 - 17.0 g/dL   HCT 69.6 (*) 29.5 - 28.4 %  MCV 85.8  78.0 - 100.0 fL   MCH 29.9  26.0 - 34.0 pg   MCHC 34.9  30.0 - 36.0 g/dL   RDW 16.1  09.6 - 04.5 %   Platelets 229  150 - 400 K/uL   Neutrophils Relative 76  43 - 77 %   Neutro Abs 14.0 (*) 1.7 - 7.7 K/uL   Lymphocytes Relative 12  12 - 46 %   Lymphs Abs 2.2  0.7 - 4.0 K/uL   Monocytes Relative 11  3 - 12 %   Monocytes Absolute 2.1 (*) 0.1 - 1.0 K/uL   Eosinophils Relative 0  0 - 5 %   Eosinophils Absolute 0.1  0.0 - 0.7 K/uL   Basophils Relative 0  0 - 1 %   Basophils Absolute 0.0  0.0 - 0.1 K/uL      Component Value Date/Time   SDES BLOOD LEFT HAND  5 ML IN EACH BOTTLE 04/14/2012 1606   SPECREQUEST Normal 04/14/2012 1606   CULT        BLOOD CULTURE RECEIVED NO GROWTH TO DATE CULTURE WILL BE HELD FOR 5 DAYS BEFORE ISSUING A FINAL NEGATIVE REPORT 04/14/2012 1606   REPTSTATUS PENDING 04/14/2012 1606   Ct Abdomen Pelvis W Contrast  04/15/2012  *RADIOLOGY REPORT*  Clinical Data: Abdominal pain and progressive white count and fever.  Status post right total hip arthroplasty.  CT ABDOMEN AND PELVIS WITH CONTRAST  Technique:  Multidetector CT imaging of the abdomen and pelvis was performed following the standard protocol during bolus administration of intravenous contrast.  Contrast: OMNIPAQUE IOHEXOL 300 MG/ML  SOLN  Comparison: None  Findings: The lung bases are clear.  Minimal right basilar atelectasis.  No effusions or pulmonary nodule.  The heart is normal in size.  No pericardial effusion.  The distal esophagus is grossly normal.  The solid abdominal organs are unremarkable.  The gallbladder is normal.  No common bile duct dilatation.  The stomach, duodenum and small bowel are unremarkable.  No small bowel obstruction or inflammation.  There is diverticulitis involving the upper sigmoid colon with associated small adjacent diverticular abscess containing a small amount of fluid and air. This  measures approximately 3.7 x 2.4 cm.  There are adjacent small reactive/inflammatory lymph nodes.  Moderate diverticulosis involving the rest of the sigmoid colon and the descending colon. The appendix is normal.  The aorta is normal in caliber.  The major branch vessels are patent.  No mesenteric or retroperitoneal mass or adenopathy. Small scattered lymph nodes are noted.  The bladder, prostate gland and seminal vesicles are normal.  No pelvic mass or adenopathy.  There is a small amount of air in the bladder but this is likely related to recent catheterization.  No  Expected postoperative changes involving the right hip prosthesis. No complicating features are demonstrated.  The left hip is normal. The pubic symphysis and SI joints are intact.  Moderate SI joint degenerative changes bilaterally with partial fusion superiorly and anteriorly.  Facet degenerative changes are noted the lumbar spine.  IMPRESSION: Acute diverticulitis with a small diverticular abscess.  Findings discussed with Dr. Darrelyn Hillock.   Original Report Authenticated By: Rudie Meyer, M.D.    Dg Chest Port 1 View  04/14/2012  *RADIOLOGY REPORT*  Clinical Data: Fever and recent right hip replacement.  PORTABLE CHEST - 1 VIEW  Comparison: 03/08/2012  Findings: Semi upright view of the chest demonstrates low lung volumes.  No focal chest disease.  Heart size is grossly  normal for technique.  The trachea is midline.  IMPRESSION: Low lung volumes without focal disease.   Original Report Authenticated By: Richarda Overlie, M.D.    Recent Results (from the past 240 hour(s))  CULTURE, BLOOD (ROUTINE X 2)     Status: None   Collection Time    04/14/12  3:58 PM      Result Value Range Status   Specimen Description BLOOD LEFT ARM  5 ML IN [facility] BOTTLE   Final   Special Requests Normal   Final   Culture  Setup Time 04/14/2012 21:30   Final   Culture     Final   Value:        BLOOD CULTURE RECEIVED NO GROWTH TO DATE CULTURE WILL BE HELD FOR 5 DAYS BEFORE  ISSUING A FINAL NEGATIVE REPORT   Report Status PENDING   Incomplete  CULTURE, BLOOD (ROUTINE X 2)     Status: None   Collection Time    04/14/12  4:06 PM      Result Value Range Status   Specimen Description BLOOD LEFT HAND  5 ML IN [facility] BOTTLE   Final   Special Requests Normal   Final   Culture  Setup Time 04/14/2012 21:30   Final   Culture     Final   Value:        BLOOD CULTURE RECEIVED NO GROWTH TO DATE CULTURE WILL BE HELD FOR 5 DAYS BEFORE ISSUING A FINAL NEGATIVE REPORT   Report Status PENDING   Incomplete      04/15/2012, 12:49 PM     LOS: 4 days

## 2012-04-15 NOTE — Progress Notes (Signed)
ANTIBIOTIC CONSULT NOTE - INITIAL  Pharmacy Consult for Zosyn Indication: diverticulitis with abscess  No Known Allergies  Patient Measurements: Height: 6\' 1"  (185.4 cm) Weight: 285 lb (129.275 kg) IBW/kg (Calculated) : 79.9  Vital Signs: Temp: 98.6 F (37 C) (03/31 0450) Temp src: Oral (03/31 0450) BP: 117/64 mmHg (03/31 0830) Pulse Rate: 92 (03/31 0830) Intake/Output from previous day: 03/30 0701 - 03/31 0700 In: 240 [P.O.:240] Out: 1300 [Urine:1300] Intake/Output from this shift: Total I/O In: 0  Out: 1001 [Urine:1000; Stool:1]  Labs:  Recent Labs  04/13/12 0510 04/14/12 0427 04/15/12 0413  WBC 13.9* 15.6* 18.4*  HGB 9.7* 9.7* 9.7*  PLT 212 201 229  CREATININE 0.73  --   --    Estimated Creatinine Clearance: 141.9 ml/min (by C-G formula based on Cr of 0.73). No results found for this basename: VANCOTROUGH, Leodis Binet, VANCORANDOM, GENTTROUGH, GENTPEAK, GENTRANDOM, TOBRATROUGH, TOBRAPEAK, TOBRARND, AMIKACINPEAK, AMIKACINTROU, AMIKACIN,  in the last 72 hours   Microbiology: Recent Results (from the past 720 hour(s))  SURGICAL PCR SCREEN     Status: None   Collection Time    04/03/12  2:13 PM      Result Value Range Status   MRSA, PCR NEGATIVE  NEGATIVE Final   Staphylococcus aureus NEGATIVE  NEGATIVE Final   Comment:            The Xpert SA Assay (FDA     approved for NASAL specimens     in patients over 59 years of age),     is one component of     a comprehensive surveillance     program.  Test performance has     been validated by [employer] for patients greater     than or equal to 23 year old.     It is not intended     to diagnose infection nor to     guide or monitor treatment.  CULTURE, BLOOD (ROUTINE X 2)     Status: None   Collection Time    04/14/12  3:58 PM      Result Value Range Status   Specimen Description BLOOD LEFT ARM  5 ML IN [facility] BOTTLE   Final   Special Requests Normal   Final   Culture  Setup Time 04/14/2012 21:30    Final   Culture     Final   Value:        BLOOD CULTURE RECEIVED NO GROWTH TO DATE CULTURE WILL BE HELD FOR 5 DAYS BEFORE ISSUING A FINAL NEGATIVE REPORT   Report Status PENDING   Incomplete  CULTURE, BLOOD (ROUTINE X 2)     Status: None   Collection Time    04/14/12  4:06 PM      Result Value Range Status   Specimen Description BLOOD LEFT HAND  5 ML IN [facility] BOTTLE   Final   Special Requests Normal   Final   Culture  Setup Time 04/14/2012 21:30   Final   Culture     Final   Value:        BLOOD CULTURE RECEIVED NO GROWTH TO DATE CULTURE WILL BE HELD FOR 5 DAYS BEFORE ISSUING A FINAL NEGATIVE REPORT   Report Status PENDING   Incomplete    Medical History: Past Medical History  Diagnosis Date  . Hypertension   . Sleep apnea     not used since 2011   . Arthritis     Assessment: 42 YOM s/p right  THA 3/27 with persistent fevers and increasing WBC.  CT of abdomen shows acute diverticulitis with a small diverticular abscess.  Infectious disease consulted and recommended starting Zosyn.  Renal function WNL and stable.  Goal of Therapy:  eradication of infeciton, doses adjusted per renal clearance  Plan:  Zosyn 3.375g IV q8h (4 hour infusion time).  Clance Boll, PharmD, BCPS  Pager: [phone] 04/15/2012 12:05 PM

## 2012-04-16 DIAGNOSIS — K630 Abscess of intestine: Secondary | ICD-10-CM

## 2012-04-16 LAB — COMPREHENSIVE METABOLIC PANEL
AST: 24 U/L (ref 0–37)
Albumin: 2.4 g/dL — ABNORMAL LOW (ref 3.5–5.2)
BUN: 15 mg/dL (ref 6–23)
Calcium: 8.6 mg/dL (ref 8.4–10.5)
Chloride: 100 mEq/L (ref 96–112)
Creatinine, Ser: 0.72 mg/dL (ref 0.50–1.35)
Total Bilirubin: 0.5 mg/dL (ref 0.3–1.2)

## 2012-04-16 LAB — CBC WITH DIFFERENTIAL/PLATELET
Basophils Absolute: 0 10*3/uL (ref 0.0–0.1)
HCT: 27.7 % — ABNORMAL LOW (ref 39.0–52.0)
Lymphocytes Relative: 17 % (ref 12–46)
Lymphs Abs: 2.5 10*3/uL (ref 0.7–4.0)
MCV: 86.3 fL (ref 78.0–100.0)
Monocytes Absolute: 1.8 10*3/uL — ABNORMAL HIGH (ref 0.1–1.0)
Neutro Abs: 10.1 10*3/uL — ABNORMAL HIGH (ref 1.7–7.7)
Platelets: 294 10*3/uL (ref 150–400)
RBC: 3.21 MIL/uL — ABNORMAL LOW (ref 4.22–5.81)
RDW: 13.2 % (ref 11.5–15.5)
WBC: 14.7 10*3/uL — ABNORMAL HIGH (ref 4.0–10.5)

## 2012-04-16 LAB — MAGNESIUM: Magnesium: 1.9 mg/dL (ref 1.5–2.5)

## 2012-04-16 NOTE — Progress Notes (Signed)
Subjective: 5 Days Post-Op Procedure(s) (LRB): TOTAL HIP ARTHROPLASTY (Right) Patient reports pain as 3 on 0-10 scale.  Case discussed with Dr. Johna Sheriff and will DC on Thursday on P.O. Antibiotics. Doing much better today.  Objective: Vital signs in last 24 hours: Temp:  [98.4 F (36.9 C)-99.2 F (37.3 C)] 99.2 F (37.3 C) (04/01 1339) Pulse Rate:  [84-88] 88 (04/01 1339) Resp:  [16-22] 16 (04/01 1339) BP: (97-111)/(63-71) 97/63 mmHg (04/01 1339) SpO2:  [97 %-99 %] 97 % (04/01 1339)  Intake/Output from previous day: 03/31 0701 - 04/01 0700 In: 1880 [P.O.:480; I.V.:1300; IV Piggyback:100] Out: 3252 [Urine:3250; Stool:2] Intake/Output this shift: Total I/O In: 930 [P.O.:480; I.V.:400; IV Piggyback:50] Out: 575 [Urine:575]   Recent Labs  04/14/12 0427 04/15/12 0413 04/16/12 0516  HGB 9.7* 9.7* 9.3*    Recent Labs  04/15/12 0413 04/16/12 0516  WBC 18.4* 14.7*  RBC 3.24* 3.21*  HCT 27.8* 27.7*  PLT 229 294    Recent Labs  04/16/12 0516  NA 134*  K 4.0  CL 100  CO2 26  BUN 15  CREATININE 0.72  GLUCOSE 100*  CALCIUM 8.6   No results found for this basename: LABPT, INR,  in the last 72 hours  Neurologically intact  Assessment/Plan: 5 Days Post-Op Procedure(s) (LRB): TOTAL HIP ARTHROPLASTY (Right) Up with therapy  Antonio Berg A 04/16/2012, 3:48 PM

## 2012-04-16 NOTE — Progress Notes (Signed)
Patient ID: Antonio Berg, male   DOB: April 30, 1955, 57 y.o.   MRN: [id number]         [facility]    Date of Admission:  04/11/2012           Day 2 piperacillin tazobactam Active Problems:   Osteoarthritis of right hip   Postoperative anemia due to acute blood loss   Acute diverticulitis   . allopurinol  100 mg Oral BID  . amLODipine  10 mg Oral QAC breakfast  . enoxaparin (LOVENOX) injection  40 mg Subcutaneous Q24H  . ferrous sulfate  325 mg Oral TID PC  . HYDROmorphone  2 mg Oral TID  . piperacillin-tazobactam (ZOSYN)  IV  3.375 g Intravenous Q8H  . potassium chloride SA  40 mEq Oral BID  . triamterene-hydrochlorothiazide  1 each Oral BID    Subjective:  His left lower quadrant pain has resolved. He had also noted some recent mild chills have also resolved. He has not had any nausea, vomiting, constipation or diarrhea.  Objective: Temp:  [98.4 F (36.9 C)-99.2 F (37.3 C)] 99.2 F (37.3 C) (04/01 1339) Pulse Rate:  [84-88] 88 (04/01 1339) Resp:  [16-22] 16 (04/01 1339) BP: (97-111)/(63-71) 97/63 mmHg (04/01 1339) SpO2:  [97 %-99 %] 97 % (04/01 1339)  General:  Alert and appears comfortable Skin:  No rash Lungs:  Clear Cor:  Regular S1 and S2 no murmurs Abdomen:  Soft and nontender  Lab Results Lab Results  Component Value Date   WBC 14.7* 04/16/2012   HGB 9.3* 04/16/2012   HCT 27.7* 04/16/2012   MCV 86.3 04/16/2012   PLT 294 04/16/2012    Lab Results  Component Value Date   CREATININE 0.72 04/16/2012   BUN 15 04/16/2012   NA 134* 04/16/2012   K 4.0 04/16/2012   CL 100 04/16/2012   CO2 26 04/16/2012    Lab Results  Component Value Date   ALT 21 04/16/2012   AST 24 04/16/2012   ALKPHOS 90 04/16/2012   BILITOT 0.5 04/16/2012      Microbiology: Recent Results (from the past 240 hour(s))  CULTURE, BLOOD (ROUTINE X 2)     Status: None   Collection Time    04/14/12  3:58 PM      Result Value Range Status   Specimen Description BLOOD LEFT ARM  5 ML IN  [facility] BOTTLE   Final   Special Requests Normal   Final   Culture  Setup Time 04/14/2012 21:30   Final   Culture     Final   Value:        BLOOD CULTURE RECEIVED NO GROWTH TO DATE CULTURE WILL BE HELD FOR 5 DAYS BEFORE ISSUING A FINAL NEGATIVE REPORT   Report Status PENDING   Incomplete  CULTURE, BLOOD (ROUTINE X 2)     Status: None   Collection Time    04/14/12  4:06 PM      Result Value Range Status   Specimen Description BLOOD LEFT HAND  5 ML IN [facility] BOTTLE   Final   Special Requests Normal   Final   Culture  Setup Time 04/14/2012 21:30   Final   Culture     Final   Value:        BLOOD CULTURE RECEIVED NO GROWTH TO DATE CULTURE WILL BE HELD FOR 5 DAYS BEFORE ISSUING A FINAL NEGATIVE REPORT   Report Status PENDING   Incomplete  URINE CULTURE  Status: None   Collection Time    04/14/12  5:39 PM      Result Value Range Status   Specimen Description URINE, CLEAN CATCH   Final   Special Requests Normal   Final   Culture  Setup Time 04/14/2012 22:05   Final   Colony Count 30,000 COLONIES/ML   Final   Culture     Final   Value: Multiple bacterial morphotypes present, none predominant. Suggest appropriate recollection if clinically indicated.   Report Status 04/15/2012 FINAL   Final    Studies/Results: Ct Abdomen Pelvis W Contrast  04/15/2012  *RADIOLOGY REPORT*  Clinical Data: Abdominal pain and progressive white count and fever.  Status post right total hip arthroplasty.  CT ABDOMEN AND PELVIS WITH CONTRAST  Technique:  Multidetector CT imaging of the abdomen and pelvis was performed following the standard protocol during bolus administration of intravenous contrast.  Contrast: OMNIPAQUE IOHEXOL 300 MG/ML  SOLN  Comparison: None  Findings: The lung bases are clear.  Minimal right basilar atelectasis.  No effusions or pulmonary nodule.  The heart is normal in size.  No pericardial effusion.  The distal esophagus is grossly normal.  The solid abdominal organs are unremarkable.  The  gallbladder is normal.  No common bile duct dilatation.  The stomach, duodenum and small bowel are unremarkable.  No small bowel obstruction or inflammation.  There is diverticulitis involving the upper sigmoid colon with associated small adjacent diverticular abscess containing a small amount of fluid and air. This measures approximately 3.7 x 2.4 cm.  There are adjacent small reactive/inflammatory lymph nodes.  Moderate diverticulosis involving the rest of the sigmoid colon and the descending colon. The appendix is normal.  The aorta is normal in caliber.  The major branch vessels are patent.  No mesenteric or retroperitoneal mass or adenopathy. Small scattered lymph nodes are noted.  The bladder, prostate gland and seminal vesicles are normal.  No pelvic mass or adenopathy.  There is a small amount of air in the bladder but this is likely related to recent catheterization.  No  Expected postoperative changes involving the right hip prosthesis. No complicating features are demonstrated.  The left hip is normal. The pubic symphysis and SI joints are intact.  Moderate SI joint degenerative changes bilaterally with partial fusion superiorly and anteriorly.  Facet degenerative changes are noted the lumbar spine.  IMPRESSION: Acute diverticulitis with a small diverticular abscess.  Findings discussed with Dr. Darrelyn Hillock.   Original Report Authenticated By: Rudie Meyer, M.D.    Dg Chest Port 1 View  04/14/2012  *RADIOLOGY REPORT*  Clinical Data: Fever and recent right hip replacement.  PORTABLE CHEST - 1 VIEW  Comparison: 03/08/2012  Findings: Semi upright view of the chest demonstrates low lung volumes.  No focal chest disease.  Heart size is grossly normal for technique.  The trachea is midline.  IMPRESSION: Low lung volumes without focal disease.   Original Report Authenticated By: Richarda Overlie, M.D.     Assessment: Antonio Berg is a small sigmoid diverticular abscess that became clinically evident right after his  recent right total hip arthroplasty. He is improving with empiric piperacillin tazobactam. I agree that he probably does not need to have any procedure to drain this small abscess. He continues to improve I will consider converting him over to oral antibiotics soon to complete his therapy.  Plan: 1. Continue piperacillin tazobactam for now 2.  I will followup tomorrow  Cliffton Asters, MD [facility] Infect[facility]  D[facility] [phone] pager   [phone] cell 04/16/2012, 3:10 PM

## 2012-04-16 NOTE — Progress Notes (Signed)
5 Days Post-Op  Subjective: Better today, tolerating clears well.  Objective: Vital signs in last 24 hours: Temp:  [98.3 F (36.8 C)-99.1 F (37.3 C)] 99.1 F (37.3 C) (04/01 0521) Pulse Rate:  [84-87] 84 (04/01 0521) Resp:  [16-22] 18 (04/01 0521) BP: (102-122)/(66-71) 111/71 mmHg (04/01 0521) SpO2:  [97 %-99 %] 97 % (04/01 0521) Last BM Date: 04/15/12 480 PO yesterday, TM 99.1 this AM, VSS, Labs OK, WBC starting to come down  Intake/Output from previous day: 03/31 0701 - 04/01 0700 In: 1880 [P.O.:480; I.V.:1300; IV Piggyback:100] Out: 3252 [Urine:3250; Stool:2] Intake/Output this shift: Total I/O In: 240 [P.O.:240] Out: -   General appearance: alert, cooperative and no distress Resp: clear to auscultation bilaterally GI: soft, non-tender; bowel sounds normal; no masses,  no organomegaly and discomfort in LLQ better this AM,  walking with PT  Lab Results:   Recent Labs  04/15/12 0413 04/16/12 0516  WBC 18.4* 14.7*  HGB 9.7* 9.3*  HCT 27.8* 27.7*  PLT 229 294    BMET  Recent Labs  04/16/12 0516  NA 134*  K 4.0  CL 100  CO2 26  GLUCOSE 100*  BUN 15  CREATININE 0.72  CALCIUM 8.6   PT/INR No results found for this basename: LABPROT, INR,  in the last 72 hours   Recent Labs Lab 04/16/12 0516  AST 24  ALT 21  ALKPHOS 90  BILITOT 0.5  PROT 5.9*  ALBUMIN 2.4*     Lipase  No results found for this basename: lipase     Studies/Results: Ct Abdomen Pelvis W Contrast  04/15/2012  *RADIOLOGY REPORT*  Clinical Data: Abdominal pain and progressive white count and fever.  Status post right total hip arthroplasty.  CT ABDOMEN AND PELVIS WITH CONTRAST  Technique:  Multidetector CT imaging of the abdomen and pelvis was performed following the standard protocol during bolus administration of intravenous contrast.  Contrast: OMNIPAQUE IOHEXOL 300 MG/ML  SOLN  Comparison: None  Findings: The lung bases are clear.  Minimal right basilar atelectasis.  No  effusions or pulmonary nodule.  The heart is normal in size.  No pericardial effusion.  The distal esophagus is grossly normal.  The solid abdominal organs are unremarkable.  The gallbladder is normal.  No common bile duct dilatation.  The stomach, duodenum and small bowel are unremarkable.  No small bowel obstruction or inflammation.  There is diverticulitis involving the upper sigmoid colon with associated small adjacent diverticular abscess containing a small amount of fluid and air. This measures approximately 3.7 x 2.4 cm.  There are adjacent small reactive/inflammatory lymph nodes.  Moderate diverticulosis involving the rest of the sigmoid colon and the descending colon. The appendix is normal.  The aorta is normal in caliber.  The major branch vessels are patent.  No mesenteric or retroperitoneal mass or adenopathy. Small scattered lymph nodes are noted.  The bladder, prostate gland and seminal vesicles are normal.  No pelvic mass or adenopathy.  There is a small amount of air in the bladder but this is likely related to recent catheterization.  No  Expected postoperative changes involving the right hip prosthesis. No complicating features are demonstrated.  The left hip is normal. The pubic symphysis and SI joints are intact.  Moderate SI joint degenerative changes bilaterally with partial fusion superiorly and anteriorly.  Facet degenerative changes are noted the lumbar spine.  IMPRESSION: Acute diverticulitis with a small diverticular abscess.  Findings discussed with Dr. Darrelyn Hillock.   Original Report Authenticated  By: Rudie Meyer, M.D.    Dg Chest Port 1 View  04/14/2012  *RADIOLOGY REPORT*  Clinical Data: Fever and recent right hip replacement.  PORTABLE CHEST - 1 VIEW  Comparison: 03/08/2012  Findings: Semi upright view of the chest demonstrates low lung volumes.  No focal chest disease.  Heart size is grossly normal for technique.  The trachea is midline.  IMPRESSION: Low lung volumes without focal  disease.   Original Report Authenticated By: Richarda Overlie, M.D.     Medications: . allopurinol  100 mg Oral BID  . amLODipine  10 mg Oral QAC breakfast  . enoxaparin (LOVENOX) injection  40 mg Subcutaneous Q24H  . ferrous sulfate  325 mg Oral TID PC  . HYDROmorphone  2 mg Oral TID  . piperacillin-tazobactam (ZOSYN)  IV  3.375 g Intravenous Q8H  . potassium chloride SA  40 mEq Oral BID  . triamterene-hydrochlorothiazide  1 each Oral BID    Assessment/Plan 1. Acute diverticulitis with perforation; 3.7 x 2.4 cm abscess.  2. S/p TOTAL HIP ARTHROPLASTY (Right) with Bone Graft to Acetabulum.  3.Hypertension  4. Reported Sleep Apnea, not on CPAP for 2 years  5.osteoarthritis  6.Gout  7.Hypokalemia  8. On XARELTO stopped 04/15/12 and starting Lovenox today. If he needs surgery or intervention he should come off this    Plan:  Continue clears and antibiotics for now.    LOS: 5 days    Dynasia Kercheval 04/16/2012

## 2012-04-16 NOTE — Progress Notes (Signed)
Patient interviewed and examined, agree with PA note above. He is clearly improved today and his abdomen is essentially nontender. I think he will likely be able to go home on oral antibiotics within a couple of days unless Dr. Darrelyn Hillock the infectious disease consultant feels that he needs a longer course of IV antibiotics due to recent placement of hardware.  Mariella Saa MD, FACS  04/16/2012 3:42 PM

## 2012-04-16 NOTE — Progress Notes (Signed)
Physical Therapy Treatment Patient Details Name: Mikhail Hallenbeck MRN: [id number] DOB: [birth year] Today's Date: 04/16/2012 Time: 4098-1191 PT Time Calculation (min): 32 min  PT Assessment / Plan / Recommendation Comments on Treatment Session  POD # 5 R posterior THR with post op GI issues.  Pt c/o increased pain today and decreased amb distance.  Performed TE's while supine in bed.  Pt recalls 3/3 THP.  Pt plans to D/C to SNF for ST Rehab.    Follow Up Recommendations  SNF     Does the patient have the potential to tolerate intense rehabilitation     Barriers to Discharge        Equipment Recommendations  Rolling walker with 5" wheels    Recommendations for Other Services    Frequency 7X/week   Plan Discharge plan remains appropriate    Precautions / Restrictions Precautions Precautions: Posterior Hip;Fall Precaution Comments: Pt recalls all THP without cues Restrictions Weight Bearing Restrictions: Yes RLE Weight Bearing: Partial weight bearing RLE Partial Weight Bearing Percentage or Pounds: "Yesterday the Doctor said I could put alittle more weight on it"   Pertinent Vitals/Pain C/o 7/10 R hip pain with act Pre medicated ICE applied    Mobility  Bed Mobility Bed Mobility: Supine to Sit Supine to Sit: 4: Min assist Details for Bed Mobility Assistance: min assist to support R LE off bed and increased time Transfers Transfers: Sit to Stand;Stand to Sit Sit to Stand: 4: Min assist;3: Mod assist;From bed Stand to Sit: 4: Min assist;3: Mod assist Details for Transfer Assistance: increased time and < 25% VC's to avoid hip flex > 90'........increased difficulty getting up from bed due to pain/stiffness Ambulation/Gait Ambulation/Gait Assistance: 4: Min assist Ambulation Distance (Feet): 86 Feet Assistive device: Rolling walker Ambulation/Gait Assistance Details: decreased amb distance due to increased c/o pain/stiffness plus required increased time. Gait Pattern: Step-to  pattern;Decreased stance time - right Gait velocity: decreased General Gait Details: Ltd by increasing pain     PT Goals                                     progressing    Visit Information  Last PT Received On: 04/16/12 Assistance Needed: +1    Subjective Data      Cognition    good   Balance   fair  End of Session  in recliner with call light in reach   Felecia Shelling  PTA [facility]  Acute  Rehab Pager      [phone]

## 2012-04-16 NOTE — Progress Notes (Signed)
CSW assisting with d/c planning. SNF is working with [employer] [place] of [place] for rehab authorization. No decision has been made at this time. Updated clinical info sent to SNF for insurance auth process. SNF will alert CSW when decision is made.  Cori Razor LCSW [phone]

## 2012-04-17 DIAGNOSIS — K5732 Diverticulitis of large intestine without perforation or abscess without bleeding: Secondary | ICD-10-CM

## 2012-04-17 LAB — CBC WITH DIFFERENTIAL/PLATELET
Basophils Absolute: 0 10*3/uL (ref 0.0–0.1)
Basophils Relative: 0 % (ref 0–1)
Eosinophils Absolute: 0.3 10*3/uL (ref 0.0–0.7)
Eosinophils Relative: 2 % (ref 0–5)
HCT: 26.8 % — ABNORMAL LOW (ref 39.0–52.0)
Hemoglobin: 9.3 g/dL — ABNORMAL LOW (ref 13.0–17.0)
Lymphocytes Relative: 23 % (ref 12–46)
Lymphs Abs: 2.9 10*3/uL (ref 0.7–4.0)
MCH: 30.1 pg (ref 26.0–34.0)
MCHC: 34.7 g/dL (ref 30.0–36.0)
MCV: 86.7 fL (ref 78.0–100.0)
Monocytes Absolute: 1.4 10*3/uL — ABNORMAL HIGH (ref 0.1–1.0)
Monocytes Relative: 11 % (ref 3–12)
Neutro Abs: 8.1 10*3/uL — ABNORMAL HIGH (ref 1.7–7.7)
Neutrophils Relative %: 64 % (ref 43–77)
Platelets: 314 10*3/uL (ref 150–400)
RBC: 3.09 MIL/uL — ABNORMAL LOW (ref 4.22–5.81)
RDW: 13.5 % (ref 11.5–15.5)
WBC: 12.7 10*3/uL — ABNORMAL HIGH (ref 4.0–10.5)

## 2012-04-17 MED ORDER — RIVAROXABAN 10 MG PO TABS
10.0000 mg | ORAL_TABLET | Freq: Every day | ORAL | Status: DC
  Administered 2012-04-17: 10 mg via ORAL
  Filled 2012-04-17 (×2): qty 1

## 2012-04-17 MED ORDER — AMOXICILLIN-POT CLAVULANATE 875-125 MG PO TABS
1.0000 | ORAL_TABLET | Freq: Two times a day (BID) | ORAL | Status: DC
  Administered 2012-04-17: 1 via ORAL
  Filled 2012-04-17 (×3): qty 1

## 2012-04-17 NOTE — Progress Notes (Signed)
CSW notified by [facility] that pt has decided to d/c home with [facility] services. OT PN reviewed and pt contacted. D/C plan confirmed and [facility] contacted to decline  bed offer. SNF will alert [employer] with change in d/c plan. CSW will sign of at this time. RNCM will assist with d/c planning.  Myrla Halsted LCSW [phone]

## 2012-04-17 NOTE — Progress Notes (Signed)
Patient interviewed and examined, agree with PA note above.  Mariella Saa MD, FACS  04/17/2012 2:46 PM

## 2012-04-17 NOTE — Progress Notes (Signed)
Subjective: 6 Days Post-Op Procedure(s) (LRB): TOTAL HIP ARTHROPLASTY (Right) Patient reports pain as 2 on 0-10 scale. Doing very well today.Will start on P.O. Antibiotics when okay with ID.He has been afebrile and improving daily. Will transfer to [facility] Thursday.CBC ordered.   Objective: Vital signs in last 24 hours: Temp:  [97.9 F (36.6 C)-99.2 F (37.3 C)] 97.9 F (36.6 C) (04/02 0620) Pulse Rate:  [77-88] 77 (04/02 0620) Resp:  [16-18] 18 (04/02 0620) BP: (97-110)/(61-63) 110/63 mmHg (04/02 0620) SpO2:  [95 %-97 %] 95 % (04/02 0620)  Intake/Output from previous day: 04/01 0701 - 04/02 0700 In: 3605 [P.O.:1680; I.V.:1725; IV Piggyback:200] Out: 2875 [Urine:2875] Intake/Output this shift:     Recent Labs  04/15/12 0413 04/16/12 0516  HGB 9.7* 9.3*    Recent Labs  04/15/12 0413 04/16/12 0516  WBC 18.4* 14.7*  RBC 3.24* 3.21*  HCT 27.8* 27.7*  PLT 229 294    Recent Labs  04/16/12 0516  NA 134*  K 4.0  CL 100  CO2 26  BUN 15  CREATININE 0.72  GLUCOSE 100*  CALCIUM 8.6   No results found for this basename: LABPT, INR,  in the last 72 hours  ABD soft Dorsiflexion/Plantar flexion intact  Assessment/Plan: 6 Days Post-Op Procedure(s) (LRB): TOTAL HIP ARTHROPLASTY (Right) Discharge to SNF  Edrick Whitehorn A 04/17/2012, 7:22 AM

## 2012-04-17 NOTE — Progress Notes (Signed)
Patient ID: Antonio Berg, male   DOB: [birth year], 57 y.o.   MRN: [id number]         [facility]    Date of Admission:  04/11/2012           Day 3 piperacillin tazobactam Active Problems:   Osteoarthritis of right hip   Postoperative anemia due to acute blood loss   Acute diverticulitis   . allopurinol  100 mg Oral BID  . amLODipine  10 mg Oral QAC breakfast  . ferrous sulfate  325 mg Oral TID PC  . HYDROmorphone  2 mg Oral TID  . piperacillin-tazobactam (ZOSYN)  IV  3.375 g Intravenous Q8H  . potassium chloride SA  40 mEq Oral BID  . rivaroxaban  10 mg Oral Daily  . triamterene-hydrochlorothiazide  1 each Oral BID    Subjective: He denies having any more problems with abdominal pain. He is tolerating his liquid diet and has had no problems with nausea or vomiting. He states that his right hip is feeling good and his been able to get up and walk in the hall with physical therapy.  Objective: Temp:  [97.9 F (36.6 C)-98.2 F (36.8 C)] 97.9 F (36.6 C) (04/02 0620) Pulse Rate:  [77-87] 77 (04/02 0620) Resp:  [18] 18 (04/02 0620) BP: (100-110)/(61-67) 109/67 mmHg (04/02 0838) SpO2:  [95 %-96 %] 95 % (04/02 0620)  General: Alert and comfortable Abdomen: Soft and nontender  Lab Results Lab Results  Component Value Date   WBC 12.7* 04/17/2012   HGB 9.3* 04/17/2012   HCT 26.8* 04/17/2012   MCV 86.7 04/17/2012   PLT 314 04/17/2012    Assessment: His had a prompt response to antibiotic therapy for diverticulitis complicated by a small diverticular abscess. I will go ahead and switch him to oral amoxicillin clavulanate now and plan on a minimum of 14 days total therapy.  Plan: 1. Change piperacillin tazobactam to oral amoxicillin clavulanate  Cliffton Asters, MD [facility] for Infectious Disease [facility] [phone] pager   [phone] cell 04/17/2012, 2:34 PM

## 2012-04-17 NOTE — Progress Notes (Signed)
Left message at Dr. Jeannetta Ellis office for his PA Amber, that OT saw patient has changed plan to home, patient needs orders for wide 3in1 and HHPT.

## 2012-04-17 NOTE — Progress Notes (Signed)
Occupational Therapy Treatment Patient Details Name: Antonio Berg MRN: [id number] DOB: 1956-01-08 Today's Date: 04/17/2012 Time: 7829-5621 OT Time Calculation (min): 27 min  OT Assessment / Plan / Recommendation Comments on Treatment Session Pt feels he can go home with [facility]. OT agrees    Follow Up Recommendations  Home health OT;Supervision/Assistance - 24 hour       Equipment Recommendations  None recommended by OT;Other (comment) (has all equipment)          Plan Discharge plan needs to be updated    Precautions / Restrictions Precautions Precautions: Posterior Hip;Fall Precaution Comments: Pt recalls all THP without cues Restrictions Weight Bearing Restrictions: Yes RLE Weight Bearing: Partial weight bearing RLE Partial Weight Bearing Percentage or Pounds:  (50)       ADL  Lower Body Dressing: Performed;Minimal assistance;Other (comment) (with AE. Wife will obtain) Where Assessed - Lower Body Dressing: Unsupported sit to stand Toilet Transfer: Performed;Min guard Toilet Transfer Method: Sit to Barista: Bedside commode Toileting - Clothing Manipulation and Hygiene: Performed;Min guard Where Assessed - Toileting Clothing Manipulation and Hygiene: Standing Transfers/Ambulation Related to ADLs: Pt and wife have a tub bench, BSC (wide) and a reacher.  Pt feels he is able to go home rather than rehab.  OT agrees      OT Goals Acute Rehab OT Goals OT Goal Formulation: With patient Time For Goal Achievement: 05/01/12 ADL Goals Pt Will Perform Lower Body Dressing: with set-up;with supervision;with adaptive equipment;Sit to stand from chair ADL Goal: Lower Body Dressing - Progress: Goal set today Pt Will Transfer to Toilet: with supervision;Raised toilet seat with arms;Maintaining hip precautions ADL Goal: Toilet Transfer - Progress: Goal set today Pt Will Perform Toileting - Clothing Manipulation: Standing;with supervision ADL Goal: Toileting -  Clothing Manipulation - Progress: Goal set today Pt Will Perform Toileting - Hygiene: with supervision;Sit to stand from 3-in-1/toilet ADL Goal: Toileting - Hygiene - Progress: Goal set today Pt Will Perform Tub/Shower Transfer: Tub transfer;Transfer tub bench;Maintaining hip precautions;Maintaining weight bearing status ADL Goal: Tub/Shower Transfer - Progress: Goal set today  Visit Information  Last OT Received On: 04/17/12    Subjective Data  Subjective: I think i can go home- i dont need rehab      Cognition  Cognition Overall Cognitive Status: Appears within functional limits for tasks assessed/performed Arousal/Alertness: Awake/alert Orientation Level: Appears intact for tasks assessed Behavior During Session: [facility] for tasks performed    Mobility  Bed Mobility Bed Mobility: Supine to Sit Supine to Sit: 4: Min assist;With rails Transfers Transfers: Sit to Stand;Stand to Sit Sit to Stand: 4: Min assist;From bed;With upper extremity assist;With armrests Stand to Sit: To chair/3-in-1;4: Min assist;With armrests;With upper extremity assist          End of Session OT - End of Session Activity Tolerance: Patient tolerated treatment well Patient left: in chair;with call bell/phone within reach;with family/visitor present  GO     Enoch Moffa, [place] 04/17/2012, 11:53 AM

## 2012-04-17 NOTE — Progress Notes (Signed)
6 Days Post-Op  Subjective: Continues to feel better, no nausea, no abdominal pain. Tolerating clears well.  Objective: Vital signs in last 24 hours: Temp:  [97.9 F (36.6 C)-99.2 F (37.3 C)] 97.9 F (36.6 C) (04/02 0620) Pulse Rate:  [77-88] 77 (04/02 0620) Resp:  [16-18] 18 (04/02 0620) BP: (97-110)/(61-67) 109/67 mmHg (04/02 0838) SpO2:  [95 %-97 %] 95 % (04/02 0620) Last BM Date: 04/15/12 Diet: Fulls, 1680 by mouth yesterday, no BM. MAXIMUM TEMPERATURE 99.2, vital signs stable, WBC continues to decline Intake/Output from previous day: 04/01 0701 - 04/02 0700 In: 3605 [P.O.:1680; I.V.:1725; IV Piggyback:200] Out: 2875 [Urine:2875] Intake/Output this shift:    General appearance: alert, cooperative and no distress GI: soft, non-tender; bowel sounds normal; no masses,  no organomegaly  Lab Results:   Recent Labs  04/16/12 0516 04/17/12 0750  WBC 14.7* 12.7*  HGB 9.3* 9.3*  HCT 27.7* 26.8*  PLT 294 314    BMET  Recent Labs  04/16/12 0516  NA 134*  K 4.0  CL 100  CO2 26  GLUCOSE 100*  BUN 15  CREATININE 0.72  CALCIUM 8.6   PT/INR No results found for this basename: LABPROT, INR,  in the last 72 hours   Recent Labs Lab 04/16/12 0516  AST 24  ALT 21  ALKPHOS 90  BILITOT 0.5  PROT 5.9*  ALBUMIN 2.4*     Lipase  No results found for this basename: lipase     Studies/Results: Ct Abdomen Pelvis W Contrast  04/15/2012  *RADIOLOGY REPORT*  Clinical Data: Abdominal pain and progressive white count and fever.  Status post right total hip arthroplasty.  CT ABDOMEN AND PELVIS WITH CONTRAST  Technique:  Multidetector CT imaging of the abdomen and pelvis was performed following the standard protocol during bolus administration of intravenous contrast.  Contrast: OMNIPAQUE IOHEXOL 300 MG/ML  SOLN  Comparison: None  Findings: The lung bases are clear.  Minimal right basilar atelectasis.  No effusions or pulmonary nodule.  The heart is normal in size.   No pericardial effusion.  The distal esophagus is grossly normal.  The solid abdominal organs are unremarkable.  The gallbladder is normal.  No common bile duct dilatation.  The stomach, duodenum and small bowel are unremarkable.  No small bowel obstruction or inflammation.  There is diverticulitis involving the upper sigmoid colon with associated small adjacent diverticular abscess containing a small amount of fluid and air. This measures approximately 3.7 x 2.4 cm.  There are adjacent small reactive/inflammatory lymph nodes.  Moderate diverticulosis involving the rest of the sigmoid colon and the descending colon. The appendix is normal.  The aorta is normal in caliber.  The major branch vessels are patent.  No mesenteric or retroperitoneal mass or adenopathy. Small scattered lymph nodes are noted.  The bladder, prostate gland and seminal vesicles are normal.  No pelvic mass or adenopathy.  There is a small amount of air in the bladder but this is likely related to recent catheterization.  No  Expected postoperative changes involving the right hip prosthesis. No complicating features are demonstrated.  The left hip is normal. The pubic symphysis and SI joints are intact.  Moderate SI joint degenerative changes bilaterally with partial fusion superiorly and anteriorly.  Facet degenerative changes are noted the lumbar spine.  IMPRESSION: Acute diverticulitis with a small diverticular abscess.  Findings discussed with Dr. Darrelyn Hillock.   Original Report Authenticated By: Rudie Meyer, M.D.     Medications: . allopurinol  100 mg  Oral BID  . amLODipine  10 mg Oral QAC breakfast  . ferrous sulfate  325 mg Oral TID PC  . HYDROmorphone  2 mg Oral TID  . piperacillin-tazobactam (ZOSYN)  IV  3.375 g Intravenous Q8H  . potassium chloride SA  40 mEq Oral BID  . rivaroxaban  10 mg Oral Daily  . triamterene-hydrochlorothiazide  1 each Oral BID    Assessment/Plan 1. Acute diverticulitis with perforation; 3.7 x 2.4 cm  abscess.  2. S/p TOTAL HIP ARTHROPLASTY (Right) with Bone Graft to Acetabulum.  3.Hypertension  4. Reported Sleep Apnea, not on CPAP for 2 years  5.osteoarthritis  6.Gout  7.Hypokalemia  8. On XARELTO stopped 04/15/12 and starting Lovenox today.  If he needs surgery or intervention he should come off this   Plan: Advanced diet to full liquids yesterday,  continue antibiotics continue to mobilize. Start a low residual diet this evening. Discussed how long to keep him on IV antibiotics versus oral antibiotic with ID.     LOS: 6 days    Md Smola 04/17/2012

## 2012-04-18 LAB — CBC
HCT: 26.7 % — ABNORMAL LOW (ref 39.0–52.0)
Hemoglobin: 9.1 g/dL — ABNORMAL LOW (ref 13.0–17.0)
RBC: 3.1 MIL/uL — ABNORMAL LOW (ref 4.22–5.81)

## 2012-04-18 MED ORDER — AMOXICILLIN-POT CLAVULANATE 875-125 MG PO TABS: 1.0000 | ORAL_TABLET | Freq: Two times a day (BID) | ORAL | Status: DC

## 2012-04-18 MED ORDER — OXYCODONE HCL 5 MG PO TABS: 5.0000 mg | ORAL_TABLET | ORAL | Status: DC | PRN

## 2012-04-18 MED ORDER — FERROUS SULFATE 325 (65 FE) MG PO TABS: 325.0000 mg | ORAL_TABLET | Freq: Three times a day (TID) | ORAL | Status: AC

## 2012-04-18 MED ORDER — RIVAROXABAN 10 MG PO TABS: 10.0000 mg | ORAL_TABLET | Freq: Every day | ORAL | Status: DC

## 2012-04-18 MED ORDER — METHOCARBAMOL 500 MG PO TABS: 500.0000 mg | ORAL_TABLET | Freq: Four times a day (QID) | ORAL | Status: DC | PRN

## 2012-04-18 NOTE — Progress Notes (Signed)
Occupational Therapy Treatment Patient Details Name: Antonio Berg MRN: [id number] DOB: 04/06/1955 Today's Date: 04/18/2012 Time: 4098-1191 OT Time Calculation (min): 24 min  OT Assessment / Plan / Recommendation Comments on Treatment Session      Follow Up Recommendations   Home Health OT    Barriers to Discharge       Equipment Recommendations  None recommended by OT;Other (comment) (has DME)    Recommendations for Other Services    Frequency     Plan Discharge plan remains appropriate    Precautions / Restrictions Precautions Precautions: Posterior Hip;Fall Precaution Comments: Pt recalls all THP without cues Restrictions Weight Bearing Restrictions: Yes RLE Weight Bearing: Partial weight bearing   Pertinent Vitals/Pain No pain; reapplied ice in chair     ADL  Grooming: Teeth care;Set up Where Assessed - Grooming: Supported standing Toilet Transfer: Buyer, retail Method: Sit to Barista:  (walked from bed to bathroom to recliner) Transfers/Ambulation Related to ADLs: Pt demonstrates good safety during tasks but needs cues for no internal rotation with turns ADL Comments: reviewed thps, especially during transfers and bed mobility.  Pt stood at toilet to urinate.  Reviewed standing for hygiene after using bathroom.  Pt practiced with AE and verbalizes he feels comfortable with this.    OT Diagnosis:    OT Problem List:   OT Treatment Interventions:     OT Goals ADL Goals Pt Will Perform Grooming: with modified independence;Standing at sink ADL Goal: Grooming - Progress: Goal set today Pt Will Transfer to Toilet: with supervision;Raised toilet seat with arms;Maintaining hip precautions ADL Goal: Toilet Transfer - Progress: Met (based on simulation to chair)  Visit Information  Last OT Received On: 04/18/12 Assistance Needed: +1    Subjective Data      Prior Functioning       Cognition   Cognition Overall Cognitive Status: Appears within functional limits for tasks assessed/performed Behavior During Session: [facility] for tasks performed    Mobility  Bed Mobility Bed Mobility: Not assessed Supine to Sit: 4: Min assist;With rails  Transfers Sit to Stand: 6: Modified independent (Device/Increase time);5: Supervision;From chair/3-in-1 Stand to Sit: 6: Modified independent (Device/Increase time);5: Supervision;To chair/3-in-1 Details for Transfer Assistance: good use of hands and safety    Exercises      Balance     End of Session OT - End of Session Activity Tolerance: Patient tolerated treatment well Patient left: in chair;with call bell/phone within reach;with family/visitor present  GO     Antonio Berg 04/18/2012, 9:22 AM Marica Otter, OTR/L [phone] 04/18/2012

## 2012-04-18 NOTE — Progress Notes (Signed)
Physical Therapy Treatment Patient Details Name: Eual Lindstrom MRN: [id number] DOB: [birth year] Today's Date: 04/18/2012 LATE ENTRY FROM [facility] 04/17/2012 Time: 4098-1191 PT Time Calculation (min): 32 min  PT Assessment / Plan / Recommendation Comments on Treatment Session  POD # 6 R posterior THR pt now plans to D/C to home vs SNF.    Follow Up Recommendations  Home health PT     Does the patient have the potential to tolerate intense rehabilitation     Barriers to Discharge        Equipment Recommendations  Rolling walker with 5" wheels    Recommendations for Other Services    Frequency     Plan Discharge plan remains appropriate    Precautions / Restrictions Restrictions Weight Bearing Restrictions: Yes RLE Weight Bearing: Partial weight bearing   Pertinent Vitals/Pain C/o soreness ICE applied    Mobility  Bed Mobility Bed Mobility: Not assessed Details for Bed Mobility Assistance: Pt OOB in recliner Transfers Transfers: Sit to Stand;Stand to Sit Sit to Stand: 6: Modified independent (Device/Increase time);5: Supervision;From chair/3-in-1 Stand to Sit: 6: Modified independent (Device/Increase time);5: Supervision;To chair/3-in-1 Details for Transfer Assistance: good use of hands and safety Ambulation/Gait Ambulation/Gait Assistance: 5: Supervision Ambulation Distance (Feet): 125 Feet Assistive device: Rolling walker Ambulation/Gait Assistance Details: increased time Gait Pattern: Step-to pattern;Decreased stance time - right Gait velocity: decreased    Exercises     PT Diagnosis:    PT Problem List:   PT Treatment Interventions:     PT Goals    Visit Information  Last PT Received On: 04/17/12 Assistance Needed: +1    Subjective Data  Subjective: I'm going home instead of Rehab   Cognition       Balance     End of Session PT - End of Session Equipment Utilized During Treatment: Gait belt Activity Tolerance: Patient tolerated treatment well;Patient  limited by pain Patient left: with call bell/phone within reach;with family/visitor present;in bed Nurse Communication: Mobility status   Felecia Shelling  PTA [facility]  Acute  Rehab Pager      [phone]

## 2012-04-18 NOTE — Progress Notes (Signed)
7 Days Post-Op  Subjective: Feeling better, and is for discharge today by Dr. Darrelyn Hillock.  He has a family physician and I have told him he can follow up with him for his diverticulitis.  Objective: Vital signs in last 24 hours: Temp:  [97.6 F (36.4 C)-98.4 F (36.9 C)] 98.4 F (36.9 C) (04/03 0500) Pulse Rate:  [83-95] 83 (04/03 0500) Resp:  [18] 18 (04/03 0800) BP: (99-128)/(64-71) 99/64 mmHg (04/03 0500) SpO2:  [96 %-98 %] 96 % (04/03 0800) Last BM Date: 04/17/12 Starting low fiber diet, 720 ml PO yesterday, afebrile, VSS, WBC 13.9 this AM Started on Augmentin yesterday by Dr. Orvan Falconer.  Intake/Output from previous day: 04/02 0701 - 04/03 0700 In: 3120 [P.O.:720; I.V.:2400] Out: 2350 [Urine:2350] Intake/Output this shift:    General appearance: alert, cooperative and no distress GI: soft, non-tender; bowel sounds normal; no masses,  no organomegaly  Lab Results:   Recent Labs  04/17/12 0750 04/18/12 0434  WBC 12.7* 13.9*  HGB 9.3* 9.1*  HCT 26.8* 26.7*  PLT 314 335    BMET  Recent Labs  04/16/12 0516  NA 134*  K 4.0  CL 100  CO2 26  GLUCOSE 100*  BUN 15  CREATININE 0.72  CALCIUM 8.6   PT/INR No results found for this basename: LABPROT, INR,  in the last 72 hours   Recent Labs Lab 04/16/12 0516  AST 24  ALT 21  ALKPHOS 90  BILITOT 0.5  PROT 5.9*  ALBUMIN 2.4*     Lipase  No results found for this basename: lipase     Studies/Results: No results found.  Medications: . allopurinol  100 mg Oral BID  . amLODipine  10 mg Oral QAC breakfast  . amoxicillin-clavulanate  1 tablet Oral Q12H  . ferrous sulfate  325 mg Oral TID PC  . HYDROmorphone  2 mg Oral TID  . potassium chloride SA  40 mEq Oral BID  . rivaroxaban  10 mg Oral Daily  . triamterene-hydrochlorothiazide  1 each Oral BID    Assessment/Plan 1. Acute diverticulitis with perforation; 3.7 x 2.4 cm abscess.  2. S/p TOTAL HIP ARTHROPLASTY (Right) with Bone Graft to Acetabulum.   3.Hypertension  4. Reported Sleep Apnea, not on CPAP for 2 years  5.osteoarthritis  6.Gout  7.Hypokalemia  8. On XARELTO stopped 04/15/12 and starting Lovenox today.  If he needs surgery or intervention he should come off this  Plan:  He is being discharged today on 14 days of oral antibiotics.  He can follow up with Primary care.  I told him to call if he has a reoccurrence of his symptoms, during his antibiotics or if it reoccurs later.  I will leave info in AVS so he can contact our office if he needs to, but that medical management is first option.  LOS: 7 days    Antonio Berg 04/18/2012

## 2012-04-18 NOTE — Progress Notes (Signed)
   Subjective: 7 Days Post-Op Procedure(s) (LRB): TOTAL HIP ARTHROPLASTY (Right) Patient reports pain as mild.   Patient seen in rounds without Dr. Darrelyn Hillock. Patient is well, and has had no acute complaints or problems. He reports that he is feeling much better today. He feels comfortable going home with home therapy as opposed to going to SNF. His wife is present with him and feels confident in helping with his care. No issues overnight. No complaints of chest pain or shortness of breath.  Plan is to go Home after hospital stay.  Objective: Vital signs in last 24 hours: Temp:  [97.6 F (36.4 C)-98.4 F (36.9 C)] 98.4 F (36.9 C) (04/03 0500) Pulse Rate:  [83-95] 83 (04/03 0500) Resp:  [18] 18 (04/03 0500) BP: (99-128)/(64-71) 99/64 mmHg (04/03 0500) SpO2:  [96 %-98 %] 96 % (04/03 0500)  Intake/Output from previous day:  Intake/Output Summary (Last 24 hours) at 04/18/12 0711 Last data filed at 04/18/12 0615  Gross per 24 hour  Intake   3120 ml  Output   2350 ml  Net    770 ml     Labs:  Recent Labs  04/16/12 0516 04/17/12 0750 04/18/12 0434  HGB 9.3* 9.3* 9.1*    Recent Labs  04/17/12 0750 04/18/12 0434  WBC 12.7* 13.9*  RBC 3.09* 3.10*  HCT 26.8* 26.7*  PLT 314 335    Recent Labs  04/16/12 0516  NA 134*  K 4.0  CL 100  CO2 26  BUN 15  CREATININE 0.72  GLUCOSE 100*  CALCIUM 8.6    EXAM General - Patient is Alert and Oriented Extremity - Neurologically intact Intact pulses distally Dorsiflexion/Plantar flexion intact No cellulitis present Compartment soft Dressing/Incision - clean, dry Motor Function - intact, moving foot and toes well on exam.   Past Medical History  Diagnosis Date  . Hypertension   . Sleep apnea     not used since 2011   . Arthritis     Assessment/Plan: 7 Days Post-Op Procedure(s) (LRB): TOTAL HIP ARTHROPLASTY (Right) Active Problems:   Osteoarthritis of right hip   Postoperative anemia due to acute blood loss  Acute diverticulitis  Estimated body mass index is 37.61 kg/(m^2) as calculated from the following:   Height as of this encounter: 6\' 1"  (1.854 m).   Weight as of this encounter: 129.275 kg (285 lb). Advance diet Up with therapy D/C IV fluids Discharge home with home health  DVT Prophylaxis - Xarelto PWB 50%  He is feeling much better. Labs show the same. Will discharge home today after session of therapy this morning. Sending home on pain meds, anticoagulant, muscle relaxer, iron, and antibiotic. Will follow up in the office in 1 week.   Akiva Brassfield LAUREN 04/18/2012, 7:11 AM

## 2012-04-18 NOTE — Progress Notes (Signed)
Physical Therapy Treatment Patient Details Name: Atha Mcbain MRN: [id number] DOB: Jan 14, 1956 Today's Date: 04/18/2012 Time: 0925-1000 PT Time Calculation (min): 35 min  PT Assessment / Plan / Recommendation Comments on Treatment Session  POD # 7 R posterior THR pt plans to D/C to home today with spouse.  Simulated/practiced going up/down one step forward approach using RW.  performed THR TE's and given handout on HEP.  Instructed on proper tech to get in/out car.    Follow Up Recommendations  Home health PT     Does the patient have the potential to tolerate intense rehabilitation     Barriers to Discharge        Equipment Recommendations   (Pt stated he has a RW at home)    Recommendations for Other Services    Frequency 7X/week   Plan Discharge plan remains appropriate    Precautions / Restrictions Precautions Precautions: Posterior Hip;Fall Precaution Comments: Pt recalls 3/3 THP and given handout Restrictions Weight Bearing Restrictions: Yes RLE Weight Bearing: Partial weight bearing   Pertinent Vitals/Pain C/o 4/10 R hip pain during gait and 7/10 during TE's Pain meds requested ICE applied    Mobility  Bed Mobility Bed Mobility: Sit to Supine Supine to Sit: 4: Min assist;With rails Sit to Supine: 5: Supervision Details for Bed Mobility Assistance: increased time and use of B UE's to assist R LE on bed Transfers Transfers: Sit to Stand;Stand to Sit Sit to Stand: 6: Modified independent (Device/Increase time);5: Supervision;From chair/3-in-1 Stand to Sit: 6: Modified independent (Device/Increase time);5: Supervision;To chair/3-in-1 Details for Transfer Assistance: good use of hands and safety Ambulation/Gait Ambulation/Gait Assistance: 6: Modified independent (Device/Increase time) Ambulation Distance (Feet): 185 Feet Ambulation/Gait Assistance Details: good safety coginition Gait Pattern: Step-to pattern;Decreased stance time - right Stairs: Yes (50% VC's on  proper sequencing and safety) Stairs Assistance: 4: Min guard Stair Management Technique: No rails;Forwards;With walker Number of Stairs: 1    Exercises   Total Hip Replacement TE's 10 reps ankle pumps 10 reps knee presses 10 reps heel slides 10 reps SAQ's 10 reps ABD Followed by ICE   PT Goals                                               progressing    Visit Information  Last PT Received On: 04/18/12 Assistance Needed: +1    Subjective Data      Cognition  Cognition Overall Cognitive Status: Appears within functional limits for tasks assessed/performed Behavior During Session: [facility] for tasks performed    Balance   fair+  End of Session PT - End of Session Equipment Utilized During Treatment: Gait belt Activity Tolerance: Patient tolerated treatment well Patient left: in bed;with call bell/phone within reach;with family/visitor present (ICE to R hip)   Felecia Shelling  PTA [facility]  Acute  Rehab Pager      [phone]

## 2012-04-19 NOTE — Discharge Summary (Signed)
Physician Discharge Summary   Patient ID: Antonio Berg MRN: [id number] DOB/AGE: [birth year] 57 y.o.  Admit date: 04/11/2012 Discharge date: 04/18/2012  Primary Diagnosis: Osteoarthritis, right hip   Admission Diagnoses:  Past Medical History  Diagnosis Date  . Hypertension   . Sleep apnea     not used since 2011   . Arthritis    Discharge Diagnoses:   Active Problems:   Osteoarthritis of right hip   Postoperative anemia due to acute blood loss   Acute diverticulitis S/P right total hip arthroplasty  Estimated body mass index is 37.61 kg/(m^2) as calculated from the following:   Height as of this encounter: 6\' 1"  (1.854 m).   Weight as of this encounter: 129.275 kg (285 lb).  Procedure(s) (LRB): TOTAL HIP ARTHROPLASTY (Right)   Consults: ID and general surgery  HPI: Antonio Berg, 57 y.o. male, has a history of pain and functional disability in the right hip(s) due to arthritis and patient has failed non-surgical conservative treatments for greater than 12 weeks to include NSAID's and/or analgesics, use of assistive devices and activity modification. Onset of symptoms was gradual starting 3 years ago with gradually worsening course since that time.The patient noted no past surgery on the right hip(s). Patient currently rates pain in the right hip at 7 out of 10 with activity. Patient has night pain, worsening of pain with activity and weight bearing, pain that interfers with activities of daily living and pain with passive range of motion. Patient has evidence of subchondral cysts, periarticular osteophytes and joint space narrowing by imaging studies. This condition presents safety issues increasing the risk of falls. There is no current active infection.     Laboratory Data: Admission on 04/11/2012, Discharged on 04/18/2012  Component Date Value Range Status  . ABO/RH(D) 04/11/2012 AB POS   Final  . Antibody Screen 04/11/2012 NEG   Final  . Sample Expiration 04/11/2012  04/14/2012   Final  . ABO/RH(D) 04/11/2012 AB POS   Final  . WBC 04/12/2012 11.6* 4.0 - 10.5 K/uL Final  . RBC 04/12/2012 3.44* 4.22 - 5.81 MIL/uL Final  . Hemoglobin 04/12/2012 10.2* 13.0 - 17.0 g/dL Final  . HCT 30/86/5784 29.7* 39.0 - 52.0 % Final  . MCV 04/12/2012 86.3  78.0 - 100.0 fL Final  . MCH 04/12/2012 29.7  26.0 - 34.0 pg Final  . MCHC 04/12/2012 34.3  30.0 - 36.0 g/dL Final  . RDW 69/62/9528 13.4  11.5 - 15.5 % Final  . Platelets 04/12/2012 225  150 - 400 K/uL Final  . Sodium 04/12/2012 136  135 - 145 mEq/L Final  . Potassium 04/12/2012 2.9* 3.5 - 5.1 mEq/L Final  . Chloride 04/12/2012 99  96 - 112 mEq/L Final  . CO2 04/12/2012 26  19 - 32 mEq/L Final  . Glucose, Bld 04/12/2012 137* 70 - 99 mg/dL Final  . BUN 41/32/4401 16  6 - 23 mg/dL Final  . Creatinine, Ser 04/12/2012 0.83  0.50 - 1.35 mg/dL Final  . Calcium 02/72/5366 8.4  8.4 - 10.5 mg/dL Final  . GFR calc non Af Amer 04/12/2012 >90  >90 mL/min Final  . GFR calc Af Amer 04/12/2012 >90  >90 mL/min Final   Comment:                                 The eGFR has been calculated  using the CKD EPI equation.                          This calculation has not been                          validated in all clinical                          situations.                          eGFR's persistently                          <90 mL/min signify                          possible Chronic Kidney Disease.  . WBC 04/13/2012 13.9* 4.0 - 10.5 K/uL Final  . RBC 04/13/2012 3.26* 4.22 - 5.81 MIL/uL Final  . Hemoglobin 04/13/2012 9.7* 13.0 - 17.0 g/dL Final  . HCT 21/30/8657 28.5* 39.0 - 52.0 % Final  . MCV 04/13/2012 87.4  78.0 - 100.0 fL Final  . MCH 04/13/2012 29.8  26.0 - 34.0 pg Final  . MCHC 04/13/2012 34.0  30.0 - 36.0 g/dL Final  . RDW 84/69/6295 13.5  11.5 - 15.5 % Final  . Platelets 04/13/2012 212  150 - 400 K/uL Final  . Sodium 04/13/2012 134* 135 - 145 mEq/L Final  . Potassium 04/13/2012 3.3* 3.5 -  5.1 mEq/L Final  . Chloride 04/13/2012 100  96 - 112 mEq/L Final  . CO2 04/13/2012 29  19 - 32 mEq/L Final  . Glucose, Bld 04/13/2012 91  70 - 99 mg/dL Final  . BUN 28/41/3244 16  6 - 23 mg/dL Final  . Creatinine, Ser 04/13/2012 0.73  0.50 - 1.35 mg/dL Final  . Calcium 01/18/7251 8.3* 8.4 - 10.5 mg/dL Final  . GFR calc non Af Amer 04/13/2012 >90  >90 mL/min Final  . GFR calc Af Amer 04/13/2012 >90  >90 mL/min Final   Comment:                                 The eGFR has been calculated                          using the CKD EPI equation.                          This calculation has not been                          validated in all clinical                          situations.                          eGFR's persistently                          <90 mL/min signify  possible Chronic Kidney Disease.  . WBC 04/14/2012 15.6* 4.0 - 10.5 K/uL Final  . RBC 04/14/2012 3.22* 4.22 - 5.81 MIL/uL Final  . Hemoglobin 04/14/2012 9.7* 13.0 - 17.0 g/dL Final  . HCT 21/30/8657 28.0* 39.0 - 52.0 % Final  . MCV 04/14/2012 87.0  78.0 - 100.0 fL Final  . MCH 04/14/2012 30.1  26.0 - 34.0 pg Final  . MCHC 04/14/2012 34.6  30.0 - 36.0 g/dL Final  . RDW 84/69/6295 13.5  11.5 - 15.5 % Final  . Platelets 04/14/2012 201  150 - 400 K/uL Final  . Neutrophils Relative 04/14/2012 71  43 - 77 % Final  . Neutro Abs 04/14/2012 11.6* 1.7 - 7.7 K/uL Final  . Lymphocytes Relative 04/14/2012 14  12 - 46 % Final  . Lymphs Abs 04/14/2012 2.4  0.7 - 4.0 K/uL Final  . Monocytes Relative 04/14/2012 14* 3 - 12 % Final  . Monocytes Absolute 04/14/2012 2.3* 0.1 - 1.0 K/uL Final  . Eosinophils Relative 04/14/2012 1  0 - 5 % Final  . Eosinophils Absolute 04/14/2012 0.2  0.0 - 0.7 K/uL Final  . Basophils Relative 04/14/2012 0  0 - 1 % Final  . Basophils Absolute 04/14/2012 0.0  0.0 - 0.1 K/uL Final  . Specimen Description 04/14/2012 URINE, CLEAN CATCH   Final  . Special Requests 04/14/2012 Normal    Final  . Culture  Setup Time 04/14/2012 04/14/2012 22:05   Final  . Colony Count 04/14/2012 30,000 COLONIES/ML   Final  . Culture 04/14/2012 Multiple bacterial morphotypes present, none predominant. Suggest appropriate recollection if clinically indicated.   Final  . Report Status 04/14/2012 04/15/2012 FINAL   Final  . Specimen Description 04/14/2012 BLOOD LEFT HAND  5 ML IN [facility] BOTTLE   Final  . Special Requests 04/14/2012 Normal   Final  . Culture  Setup Time 04/14/2012 04/14/2012 21:30   Final  . Culture 04/14/2012        BLOOD CULTURE RECEIVED NO GROWTH TO DATE CULTURE WILL BE HELD FOR 5 DAYS BEFORE ISSUING A FINAL NEGATIVE REPORT   Final  . Report Status 04/14/2012 PENDING   Incomplete  . Specimen Description 04/14/2012 BLOOD LEFT ARM  5 ML IN [facility] BOTTLE   Final  . Special Requests 04/14/2012 Normal   Final  . Culture  Setup Time 04/14/2012 04/14/2012 21:30   Final  . Culture 04/14/2012        BLOOD CULTURE RECEIVED NO GROWTH TO DATE CULTURE WILL BE HELD FOR 5 DAYS BEFORE ISSUING A FINAL NEGATIVE REPORT   Final  . Report Status 04/14/2012 PENDING   Incomplete  . WBC 04/15/2012 18.4* 4.0 - 10.5 K/uL Final  . RBC 04/15/2012 3.24* 4.22 - 5.81 MIL/uL Final  . Hemoglobin 04/15/2012 9.7* 13.0 - 17.0 g/dL Final  . HCT 28/41/3244 27.8* 39.0 - 52.0 % Final  . MCV 04/15/2012 85.8  78.0 - 100.0 fL Final  . MCH 04/15/2012 29.9  26.0 - 34.0 pg Final  . MCHC 04/15/2012 34.9  30.0 - 36.0 g/dL Final  . RDW 01/18/7251 13.2  11.5 - 15.5 % Final  . Platelets 04/15/2012 229  150 - 400 K/uL Final  . Neutrophils Relative 04/15/2012 76  43 - 77 % Final  . Neutro Abs 04/15/2012 14.0* 1.7 - 7.7 K/uL Final  . Lymphocytes Relative 04/15/2012 12  12 - 46 % Final  . Lymphs Abs 04/15/2012 2.2  0.7 - 4.0 K/uL Final  . Monocytes Relative 04/15/2012 11  3 -  12 % Final  . Monocytes Absolute 04/15/2012 2.1* 0.1 - 1.0 K/uL Final  . Eosinophils Relative 04/15/2012 0  0 - 5 % Final  . Eosinophils Absolute 04/15/2012  0.1  0.0 - 0.7 K/uL Final  . Basophils Relative 04/15/2012 0  0 - 1 % Final  . Basophils Absolute 04/15/2012 0.0  0.0 - 0.1 K/uL Final  . HIV 04/15/2012 NON REACTIVE  NON REACTIVE Final  . HCV Ab 04/15/2012 NEGATIVE  NEGATIVE Final  . Sodium 04/16/2012 134* 135 - 145 mEq/L Final  . Potassium 04/16/2012 4.0  3.5 - 5.1 mEq/L Final  . Chloride 04/16/2012 100  96 - 112 mEq/L Final  . CO2 04/16/2012 26  19 - 32 mEq/L Final  . Glucose, Bld 04/16/2012 100* 70 - 99 mg/dL Final  . BUN 91/47/8295 15  6 - 23 mg/dL Final  . Creatinine, Ser 04/16/2012 0.72  0.50 - 1.35 mg/dL Final  . Calcium 62/13/0865 8.6  8.4 - 10.5 mg/dL Final  . Total Protein 04/16/2012 5.9* 6.0 - 8.3 g/dL Final  . Albumin 78/46/9629 2.4* 3.5 - 5.2 g/dL Final  . AST 52/84/1324 24  0 - 37 U/L Final  . ALT 04/16/2012 21  0 - 53 U/L Final  . Alkaline Phosphatase 04/16/2012 90  39 - 117 U/L Final  . Total Bilirubin 04/16/2012 0.5  0.3 - 1.2 mg/dL Final  . GFR calc non Af Amer 04/16/2012 >90  >90 mL/min Final  . GFR calc Af Amer 04/16/2012 >90  >90 mL/min Final   Comment:                                 The eGFR has been calculated                          using the CKD EPI equation.                          This calculation has not been                          validated in all clinical                          situations.                          eGFR's persistently                          <90 mL/min signify                          possible Chronic Kidney Disease.  . Magnesium 04/16/2012 1.9  1.5 - 2.5 mg/dL Final  . WBC 40/10/2723 14.7* 4.0 - 10.5 K/uL Final  . RBC 04/16/2012 3.21* 4.22 - 5.81 MIL/uL Final  . Hemoglobin 04/16/2012 9.3* 13.0 - 17.0 g/dL Final  . HCT 36/64/4034 27.7* 39.0 - 52.0 % Final  . MCV 04/16/2012 86.3  78.0 - 100.0 fL Final  . MCH 04/16/2012 29.0  26.0 - 34.0 pg Final  . MCHC 04/16/2012 33.6  30.0 - 36.0 g/dL Final  . RDW 74/25/9563 13.2  11.5 - 15.5 % Final  . Platelets 04/16/2012  294  150 - 400  K/uL Final  . Neutrophils Relative 04/16/2012 69  43 - 77 % Final  . Neutro Abs 04/16/2012 10.1* 1.7 - 7.7 K/uL Final  . Lymphocytes Relative 04/16/2012 17  12 - 46 % Final  . Lymphs Abs 04/16/2012 2.5  0.7 - 4.0 K/uL Final  . Monocytes Relative 04/16/2012 12  3 - 12 % Final  . Monocytes Absolute 04/16/2012 1.8* 0.1 - 1.0 K/uL Final  . Eosinophils Relative 04/16/2012 2  0 - 5 % Final  . Eosinophils Absolute 04/16/2012 0.2  0.0 - 0.7 K/uL Final  . Basophils Relative 04/16/2012 0  0 - 1 % Final  . Basophils Absolute 04/16/2012 0.0  0.0 - 0.1 K/uL Final  . WBC 04/17/2012 12.7* 4.0 - 10.5 K/uL Final  . RBC 04/17/2012 3.09* 4.22 - 5.81 MIL/uL Final  . Hemoglobin 04/17/2012 9.3* 13.0 - 17.0 g/dL Final  . HCT 16/10/9602 26.8* 39.0 - 52.0 % Final  . MCV 04/17/2012 86.7  78.0 - 100.0 fL Final  . MCH 04/17/2012 30.1  26.0 - 34.0 pg Final  . MCHC 04/17/2012 34.7  30.0 - 36.0 g/dL Final  . RDW 54/09/8117 13.5  11.5 - 15.5 % Final  . Platelets 04/17/2012 314  150 - 400 K/uL Final  . Neutrophils Relative 04/17/2012 64  43 - 77 % Final  . Lymphocytes Relative 04/17/2012 23  12 - 46 % Final  . Monocytes Relative 04/17/2012 11  3 - 12 % Final  . Eosinophils Relative 04/17/2012 2  0 - 5 % Final  . Basophils Relative 04/17/2012 0  0 - 1 % Final  . Neutro Abs 04/17/2012 8.1* 1.7 - 7.7 K/uL Final  . Lymphs Abs 04/17/2012 2.9  0.7 - 4.0 K/uL Final  . Monocytes Absolute 04/17/2012 1.4* 0.1 - 1.0 K/uL Final  . Eosinophils Absolute 04/17/2012 0.3  0.0 - 0.7 K/uL Final  . Basophils Absolute 04/17/2012 0.0  0.0 - 0.1 K/uL Final  . WBC Morphology 04/17/2012 MILD LEFT SHIFT (1-5% METAS, OCC MYELO, OCC BANDS)   Final  . WBC 04/18/2012 13.9* 4.0 - 10.5 K/uL Final  . RBC 04/18/2012 3.10* 4.22 - 5.81 MIL/uL Final  . Hemoglobin 04/18/2012 9.1* 13.0 - 17.0 g/dL Final  . HCT 14/78/2956 26.7* 39.0 - 52.0 % Final  . MCV 04/18/2012 86.1  78.0 - 100.0 fL Final  . MCH 04/18/2012 29.4  26.0 - 34.0 pg Final  . MCHC  04/18/2012 34.1  30.0 - 36.0 g/dL Final  . RDW 21/30/8657 13.6  11.5 - 15.5 % Final  . Platelets 04/18/2012 335  150 - 400 K/uL Final  Hospital Outpatient Visit on 04/03/2012  Component Date Value Range Status  . aPTT 04/03/2012 33  24 - 37 seconds Final  . Sodium 04/03/2012 142  135 - 145 mEq/L Final  . Potassium 04/03/2012 3.2* 3.5 - 5.1 mEq/L Final  . Chloride 04/03/2012 101  96 - 112 mEq/L Final  . CO2 04/03/2012 31  19 - 32 mEq/L Final  . Glucose, Bld 04/03/2012 80  70 - 99 mg/dL Final  . BUN 84/69/6295 19  6 - 23 mg/dL Final  . Creatinine, Ser 04/03/2012 0.95  0.50 - 1.35 mg/dL Final  . Calcium 28/41/3244 9.6  8.4 - 10.5 mg/dL Final  . Total Protein 04/03/2012 7.9  6.0 - 8.3 g/dL Final  . Albumin 01/18/7251 3.8  3.5 - 5.2 g/dL Final  . AST 66/44/0347 17  0 - 37 U/L Final  .  ALT 04/03/2012 14  0 - 53 U/L Final  . Alkaline Phosphatase 04/03/2012 92  39 - 117 U/L Final  . Total Bilirubin 04/03/2012 0.2* 0.3 - 1.2 mg/dL Final  . GFR calc non Af Amer 04/03/2012 >90  >90 mL/min Final  . GFR calc Af Amer 04/03/2012 >90  >90 mL/min Final   Comment:                                 The eGFR has been calculated                          using the CKD EPI equation.                          This calculation has not been                          validated in all clinical                          situations.                          eGFR's persistently                          <90 mL/min signify                          possible Chronic Kidney Disease.  Marland Kitchen Prothrombin Time 04/03/2012 13.1  11.6 - 15.2 seconds Final  . INR 04/03/2012 1.00  0.00 - 1.49 Final  . Color, Urine 04/03/2012 YELLOW  YELLOW Final  . APPearance 04/03/2012 CLEAR  CLEAR Final  . Specific Gravity, Urine 04/03/2012 1.016  1.005 - 1.030 Final  . pH 04/03/2012 7.0  5.0 - 8.0 Final  . Glucose, UA 04/03/2012 NEGATIVE  NEGATIVE mg/dL Final  . Hgb urine dipstick 04/03/2012 NEGATIVE  NEGATIVE Final  . Bilirubin Urine  04/03/2012 NEGATIVE  NEGATIVE Final  . Ketones, ur 04/03/2012 NEGATIVE  NEGATIVE mg/dL Final  . Protein, ur 16/10/9602 NEGATIVE  NEGATIVE mg/dL Final  . Urobilinogen, UA 04/03/2012 0.2  0.0 - 1.0 mg/dL Final  . Nitrite 54/09/8117 NEGATIVE  NEGATIVE Final  . Leukocytes, UA 04/03/2012 NEGATIVE  NEGATIVE Final   MICROSCOPIC NOT DONE ON URINES WITH NEGATIVE PROTEIN, BLOOD, LEUKOCYTES, NITRITE, OR GLUCOSE <1000 mg/dL.  Marland Kitchen MRSA, PCR 04/03/2012 NEGATIVE  NEGATIVE Final  . Staphylococcus aureus 04/03/2012 NEGATIVE  NEGATIVE Final   Comment:                                 The Xpert SA Assay (FDA                          approved for NASAL specimens                          in patients over 86 years of age),                          is one component  of                          a comprehensive surveillance                          program.  Test performance has                          been validated by [facility] for patients greater                          than or equal to 64 year old.                          It is not intended                          to diagnose infection nor to                          guide or monitor treatment.  . WBC 04/03/2012 9.2  4.0 - 10.5 K/uL Final  . RBC 04/03/2012 4.66  4.22 - 5.81 MIL/uL Final  . Hemoglobin 04/03/2012 13.7  13.0 - 17.0 g/dL Final  . HCT 16/10/9602 41.1  39.0 - 52.0 % Final  . MCV 04/03/2012 88.2  78.0 - 100.0 fL Final  . MCH 04/03/2012 29.4  26.0 - 34.0 pg Final  . MCHC 04/03/2012 33.3  30.0 - 36.0 g/dL Final  . RDW 54/09/8117 13.1  11.5 - 15.5 % Final  . Platelets 04/03/2012 313  150 - 400 K/uL Final     X-Rays:Ct Abdomen Pelvis W Contrast  04/15/2012  *RADIOLOGY REPORT*  Clinical Data: Abdominal pain and progressive white count and fever.  Status post right total hip arthroplasty.  CT ABDOMEN AND PELVIS WITH CONTRAST  Technique:  Multidetector CT imaging of the abdomen and pelvis was performed following the  standard protocol during bolus administration of intravenous contrast.  Contrast: OMNIPAQUE IOHEXOL 300 MG/ML  SOLN  Comparison: None  Findings: The lung bases are clear.  Minimal right basilar atelectasis.  No effusions or pulmonary nodule.  The heart is normal in size.  No pericardial effusion.  The distal esophagus is grossly normal.  The solid abdominal organs are unremarkable.  The gallbladder is normal.  No common bile duct dilatation.  The stomach, duodenum and small bowel are unremarkable.  No small bowel obstruction or inflammation.  There is diverticulitis involving the upper sigmoid colon with associated small adjacent diverticular abscess containing a small amount of fluid and air. This measures approximately 3.7 x 2.4 cm.  There are adjacent small reactive/inflammatory lymph nodes.  Moderate diverticulosis involving the rest of the sigmoid colon and the descending colon. The appendix is normal.  The aorta is normal in caliber.  The major branch vessels are patent.  No mesenteric or retroperitoneal mass or adenopathy. Small scattered lymph nodes are noted.  The bladder, prostate gland and seminal vesicles are normal.  No pelvic mass or adenopathy.  There is a small amount of air in the bladder but this is likely related to recent catheterization.  No  Expected postoperative changes involving the right hip prosthesis. No complicating features are demonstrated.  The left hip is normal. The pubic symphysis and SI joints are intact.  Moderate SI joint degenerative changes bilaterally with partial fusion superiorly and anteriorly.  Facet degenerative changes are noted the lumbar spine.  IMPRESSION: Acute diverticulitis with a small diverticular abscess.  Findings discussed with Dr. Darrelyn Hillock.   Original Report Authenticated By: Rudie Meyer, M.D.    Dg Chest Port 1 View  04/14/2012  *RADIOLOGY REPORT*  Clinical Data: Fever and recent right hip replacement.  PORTABLE CHEST - 1 VIEW  Comparison:  03/08/2012  Findings: Semi upright view of the chest demonstrates low lung volumes.  No focal chest disease.  Heart size is grossly normal for technique.  The trachea is midline.  IMPRESSION: Low lung volumes without focal disease.   Original Report Authenticated By: Richarda Overlie, M.D.    Dg Hip Portable 1 View Right  04/11/2012  *RADIOLOGY REPORT*  Clinical Data: Postoperative status.  Total right hip replacement.  PORTABLE RIGHT HIP - 1 VIEW  Comparison: 06/01/2011.  Findings: Portable AP image is submitted.  A surgical drain is in place.  Right hip arthroplasty has been performed.  No dislocation is seen.  There is expected relationship between acetabular femoral components. Surgical drain is in place.  IMPRESSION: Right hip arthroplasty has been performed.  No dislocation or disruption of the hardware is evident.   Original Report Authenticated By: Onalee Hua Call     EKG: Orders placed during the hospital encounter of 04/03/12  . EKG 12-LEAD  . EKG 12-LEAD     Hospital Course: Patient was admitted to [facility] and taken to the OR and underwent the above state procedure without complications.  Patient tolerated the procedure well and was later transferred to the recovery room and then to the orthopaedic floor for postoperative care.  They were given PO and IV analgesics for pain control following their surgery.  They were given 24 hours of postoperative antibiotics of  Anti-infectives   Start     Dose/Rate Route Frequency Ordered Stop   04/18/12 0000  amoxicillin-clavulanate (AUGMENTIN) 875-125 MG per tablet     1 tablet Oral Every 12 hours 04/18/12 0704     04/17/12 2000  amoxicillin-clavulanate (AUGMENTIN) 875-125 MG per tablet 1 tablet  Status:  Discontinued     1 tablet Oral Every 12 hours 04/17/12 1438 04/18/12 1411   04/15/12 1215  piperacillin-tazobactam (ZOSYN) IVPB 3.375 g  Status:  Discontinued     3.375 g 12.5 mL/hr over 240 Minutes Intravenous Every 8 hours 04/15/12 1200  04/17/12 1438   04/11/12 1400  ceFAZolin (ANCEF) IVPB 1 g/50 mL premix     1 g 100 mL/hr over 30 Minutes Intravenous Every 6 hours 04/11/12 1130 04/11/12 1957   04/11/12 0802  polymyxin B 500,000 Units, bacitracin 50,000 Units in sodium chloride irrigation 0.9 % 500 mL irrigation  Status:  Discontinued       As needed 04/11/12 0802 04/11/12 0928   04/11/12 0600  ceFAZolin (ANCEF) 3 g in dextrose 5 % 50 mL IVPB    Comments:  Dose increased to 3g per P&T policy for weight >120kg.   3 g 160 mL/hr over 30 Minutes Intravenous On call to O.R. 04/11/12 4098 04/11/12 0736     and started on DVT prophylaxis in the form of Xarelto.   PT and OT were ordered for total hip protocol.  The patient was allowed to  be PWB 50% with therapy. Discharge planning was consulted to help with postop disposition and equipment needs.  Patient had a decent night on the evening of surgery, reporting that he was able to get some rest.  They started to get up OOB with therapy on day one.  Hemovac drain was pulled without difficulty.  Dressing changed as he as moderate drainage from hemovac site. The knee immobilizer was removed and discontinued.  Continued to work with therapy into day two.  Dressing was changed on day two and the incision was clean and dry. Day three, the patient started to develop fever and chills, in addition to increased white count. Fever responded to tylenol. On post op day four, the patient developed a fever again becoming unresponsive to tylenol. Chest x-ray, blood cultures, and urine culture were ordered which were negative. Day five, the patient developed abdominal pain. Ct was ordered which showed acute diverticulitis with a diverticular abscess. ID and general surgery were consulted. He was started on IV antibiotics. Patient responded quickly to treatment. He was continuely monitored and ID felt confident with oral coverage. He patient had progressed with therapy and meeting their goals.  Incision was  healing well.  Patient was seen in rounds one week after surgery and was ready to go home.   Discharge Medications: Prior to Admission medications   Medication Sig Start Date End Date Taking? Authorizing Provider  allopurinol (ZYLOPRIM) 100 MG tablet Take 100 mg by mouth 2 (two) times daily.   Yes Historical Provider, MD  amLODipine (NORVASC) 10 MG tablet Take 10 mg by mouth daily before breakfast.   Yes Historical Provider, MD  amoxicillin-clavulanate (AUGMENTIN) 875-125 MG per tablet Take 1 tablet by mouth every 12 (twelve) hours. 04/18/12   Narek Kniss Tamala Ser, PA-C  ferrous sulfate 325 (65 FE) MG tablet Take 1 tablet (325 mg total) by mouth 3 (three) times daily after meals. 04/18/12   Marlea Gambill Tamala Ser, PA-C  methocarbamol (ROBAXIN) 500 MG tablet Take 1 tablet (500 mg total) by mouth every 6 (six) hours as needed. 04/18/12   Asser Lucena Tamala Ser, PA-C  oxyCODONE (OXY IR/ROXICODONE) 5 MG immediate release tablet Take 1-3 tablets (5-15 mg total) by mouth every 4 (four) hours as needed. 04/18/12   Ceira Hoeschen Tamala Ser, PA-C  potassium chloride SA (K-DUR,KLOR-CON) 20 MEQ tablet Take 20 mEq by mouth 2 (two) times daily.   Yes Historical Provider, MD  rivaroxaban (XARELTO) 10 MG TABS tablet Take 1 tablet (10 mg total) by mouth daily. 04/18/12   Lorin Gawron Tamala Ser, PA-C  triamterene-hydrochlorothiazide (DYAZIDE) 37.5-25 MG per capsule Take 1 capsule by mouth 2 (two) times daily.   Yes Historical Provider, MD    Diet: Cardiac diet Activity:PWB No bending hip over 90 degrees- A "L" Angle Do not cross legs Do not let foot roll inward When turning these patients a pillow should be placed between the patient's legs to prevent crossing. Patients should have the affected knee fully extended when trying to sit or stand from all surfaces to prevent excessive hip flexion. When ambulating and turning toward the affected side the affected leg should have the toes turned out prior to moving the walker  and the rest of patient's body as to prevent internal rotation/ turning in of the leg. Abduction pillows are the most effective way to prevent a patient from not crossing legs or turning toes in at rest. If an abduction pillow is not ordered placing a regular pillow length wise between the patient's legs  is also an effective reminder. It is imperative that these precautions be maintained so that the surgical hip does not dislocate. Follow-up:in 1 week Disposition - Home Discharged Condition: fair   Discharge Orders   Future Orders Complete By Expires     Call MD / Call 911  As directed     Comments:      If you experience chest pain or shortness of breath, CALL 911 and be transported to the hospital emergency room.  If you develope a fever above 101 F, pus (white drainage) or increased drainage or redness at the wound, or calf pain, call your surgeon's office.    Change dressing  As directed     Comments:      You may change your dressing daily with sterile 4 x 4 inch gauze dressing and paper tape.    Constipation Prevention  As directed     Comments:      Drink plenty of fluids.  Prune juice may be helpful.  You may use a stool softener, such as Colace (over the counter) 100 mg twice a day.  Use MiraLax (over the counter) for constipation as needed.    Diet - low sodium heart healthy  As directed     Discharge instructions  As directed     Comments:      Walk with your walker. Weight bearing as instructed. [facility] will follow you at home for your therapy  Change your dressing daily. Shower only, no tub bath. Call if any temperatures greater than 101 or any wound complications: [phone] during the day and ask for Dr. Jeannetta Ellis nurse, [facility].    Driving restrictions  As directed     Comments:      No driving for 2 weeks    Follow the hip precautions as taught in Physical Therapy  As directed     Partial weight bearing  As directed     Comments:      50%     Patient may shower  As directed     Comments:      You may shower without a dressing once there is no drainage.  Do not wash over the wound.  If drainage remains, cover wound with plastic wrap and then shower.        Medication List    STOP taking these medications       HYDROmorphone 2 MG tablet  Commonly known as:  DILAUDID     oxyCODONE-acetaminophen 10-325 MG per tablet  Commonly known as:  PERCOCET     oxymorphone 30 MG 12 hr tablet  Commonly known as:  OPANA ER      TAKE these medications       allopurinol 100 MG tablet  Commonly known as:  ZYLOPRIM  Take 100 mg by mouth 2 (two) times daily.     amLODipine 10 MG tablet  Commonly known as:  NORVASC  Take 10 mg by mouth daily before breakfast.     amoxicillin-clavulanate 875-125 MG per tablet  Commonly known as:  AUGMENTIN  Take 1 tablet by mouth every 12 (twelve) hours.     ferrous sulfate 325 (65 FE) MG tablet  Take 1 tablet (325 mg total) by mouth 3 (three) times daily after meals.     methocarbamol 500 MG tablet  Commonly known as:  ROBAXIN  Take 1 tablet (500 mg total) by mouth every 6 (six) hours as needed.     oxyCODONE  5 MG immediate release tablet  Commonly known as:  Oxy IR/ROXICODONE  Take 1-3 tablets (5-15 mg total) by mouth every 4 (four) hours as needed.     potassium chloride SA 20 MEQ tablet  Commonly known as:  K-DUR,KLOR-CON  Take 20 mEq by mouth 2 (two) times daily.     rivaroxaban 10 MG Tabs tablet  Commonly known as:  XARELTO  Take 1 tablet (10 mg total) by mouth daily.     triamterene-hydrochlorothiazide 37.5-25 MG per capsule  Commonly known as:  DYAZIDE  Take 1 capsule by mouth 2 (two) times daily.           Follow-up Information   Follow up with GIOFFRE,RONALD A, MD. Schedule an appointment as soon as possible for a visit in 1 week.   Contact information:   [address] [place] [address] [phone]       Follow up with  Elijio Miles, MD. Call in 1 week. (Call and schedule follow up for diverticulits.  Call him for fever, or reoccurance of pain in abdomen like before.)    Contact information:   [address] [place] [address]       Schedule an appointment as soon as possible for a visit with Mariella Saa, MD. (As needed, if you have pain and Dr. Alben Spittle thinks you need a surgeon.)    Contact information:   [address] [place] [address] [phone]       Signed: Kerby Nora 04/19/2012, 7:23 AM

## 2012-04-19 NOTE — Progress Notes (Signed)
Patient interviewed and examined, agree with PA note above.  Mariella Saa MD, FACS  04/19/2012 5:38 PM

## 2012-04-20 LAB — CULTURE, BLOOD (ROUTINE X 2)
Culture: NO GROWTH
Culture: NO GROWTH
Special Requests: NORMAL
Special Requests: NORMAL

## 2013-12-10 IMAGING — CT CT ABD-PELV W/ CM
2 of 5 series · 16 of 46 positions shown, 18 images · IV contrast (OMNIPAQUE)
Comparison: None

CLINICAL DATA: Abdominal pain and progressive white count and
fever.  Status post right total hip arthroplasty.

CT ABDOMEN AND PELVIS WITH CONTRAST
TECHNIQUE: Multidetector CT imaging of the abdomen and pelvis was
performed following the standard protocol during bolus
administration of intravenous contrast.
Contrast: 100mL OMNIPAQUE IOHEXOL 300 MG/ML  SOLN

[Series 2: rtn a/p with · axial · 0.85mm/px · z∈[-486,-61]mm · 13 of 97 slices shown, 15 images]
[im 6/97  soft-tissue]
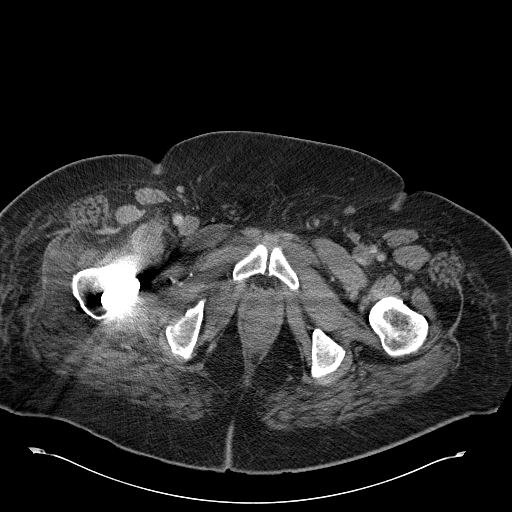
[im 6/97  bone]
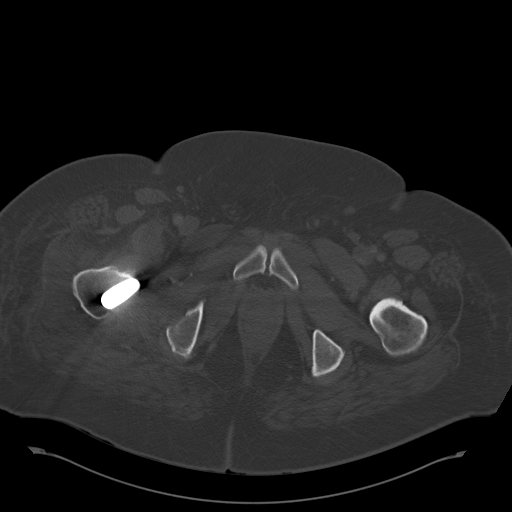
[im 11/97  soft-tissue]
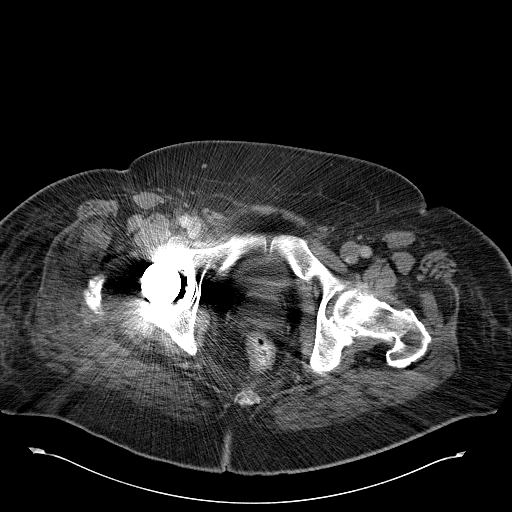
[im 22/97  soft-tissue]
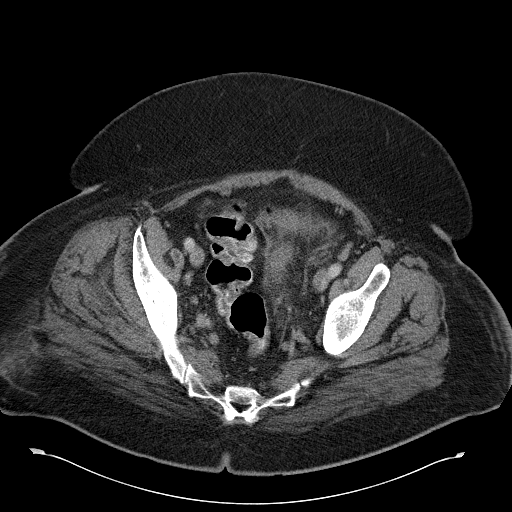
[im 27/97  soft-tissue]
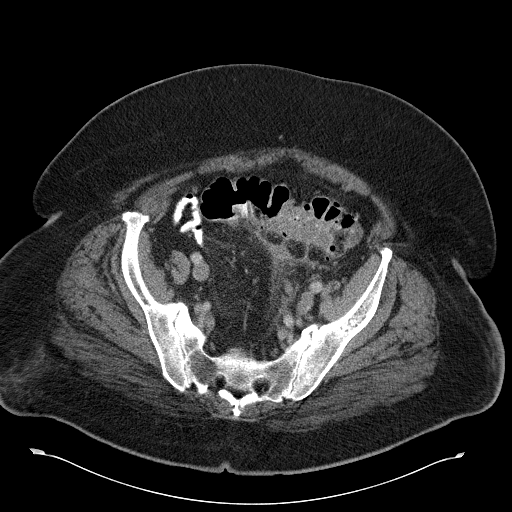
[im 33/97  soft-tissue]
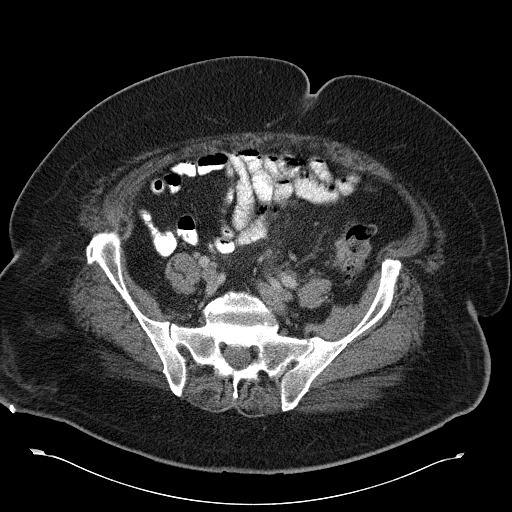
[im 43/97  soft-tissue]
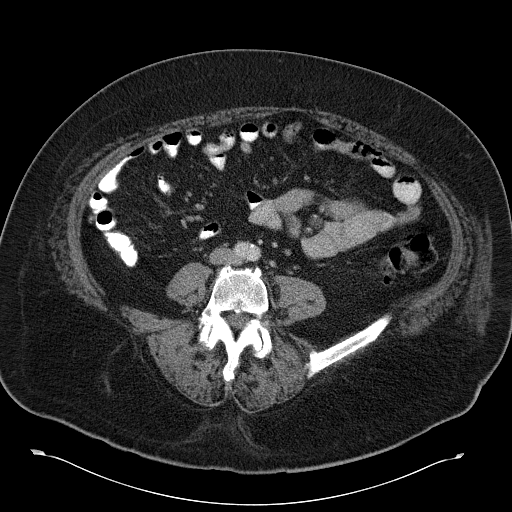
[im 49/97  soft-tissue]
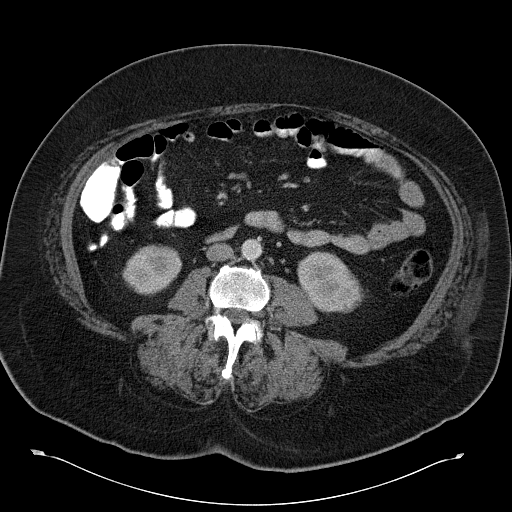
[im 54/97  soft-tissue]
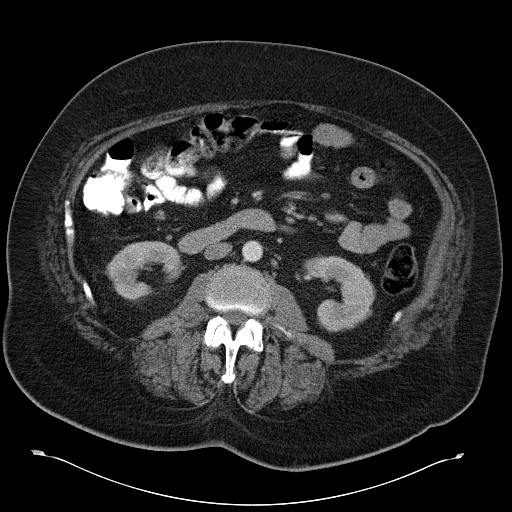
[im 65/97  soft-tissue]
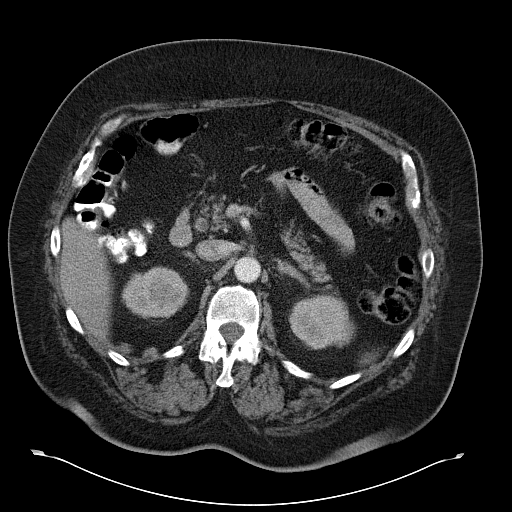
[im 65/97  bone]
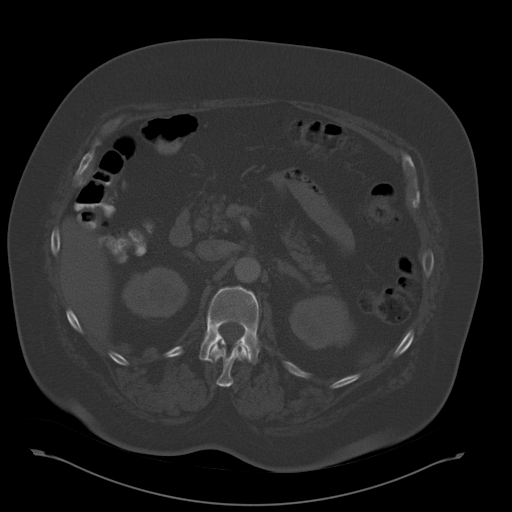
[im 70/97  soft-tissue]
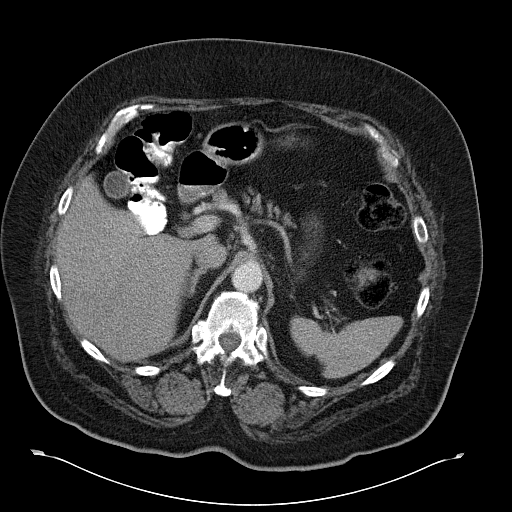
[im 75/97  soft-tissue]
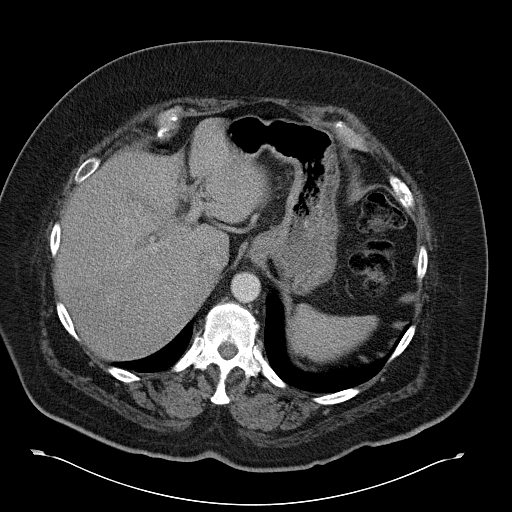
[im 86/97  soft-tissue]
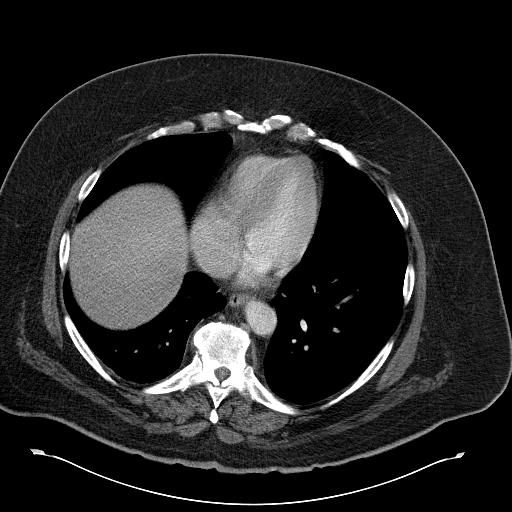
[im 91/97  soft-tissue]
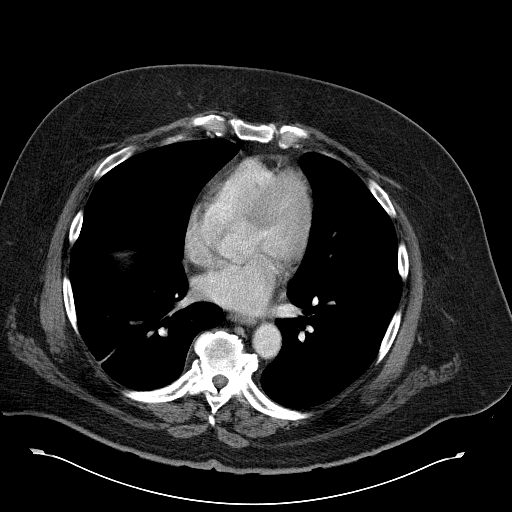

[Series 602: <mpr thick range> · coronal · 0.95mm/px · 3 of 110 slices shown]
[im 37/110  soft-tissue]
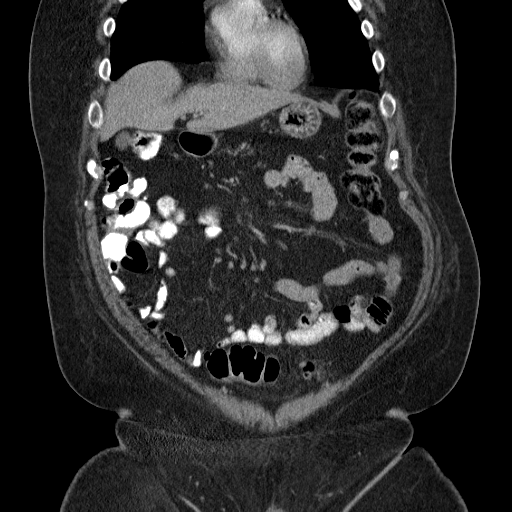
[im 49/110  soft-tissue]
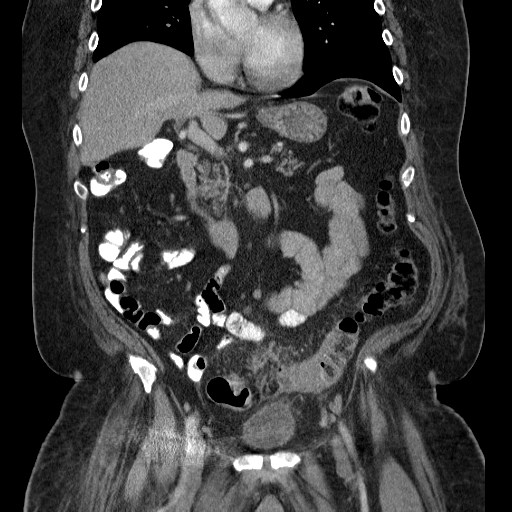
[im 61/110  soft-tissue]
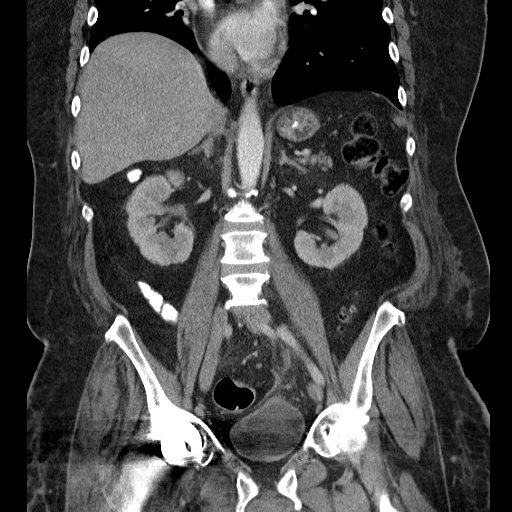

[16 of 46 positions shown; findings below may reference images not displayed]

FINDINGS: The lung bases are clear.  Minimal right basilar
atelectasis.  No effusions or pulmonary nodule.  The heart is
normal in size.  No pericardial effusion.  The distal esophagus is
grossly normal.

The solid abdominal organs are unremarkable.  The gallbladder is
normal.  No common bile duct dilatation.

The stomach, duodenum and small bowel are unremarkable.  No small
bowel obstruction or inflammation.  There is diverticulitis
involving the upper sigmoid colon with associated small adjacent
diverticular abscess containing a small amount of fluid and air.
This measures approximately 3.7 x 2.4 cm.  There are adjacent small
reactive/inflammatory lymph nodes.  Moderate diverticulosis
involving the rest of the sigmoid colon and the descending colon.
The appendix is normal.

The aorta is normal in caliber.  The major branch vessels are
patent.  No mesenteric or retroperitoneal mass or adenopathy.
Small scattered lymph nodes are noted.

The bladder, prostate gland and seminal vesicles are normal.  No
pelvic mass or adenopathy.  There is a small amount of air in the
bladder but this is likely related to recent catheterization.  No

Expected postoperative changes involving the right hip prosthesis.
No complicating features are demonstrated.  The left hip is normal.
The pubic symphysis and SI joints are intact.  Moderate SI joint
degenerative changes bilaterally with partial fusion superiorly and
anteriorly.  Facet degenerative changes are noted the lumbar spine.
IMPRESSION: Acute diverticulitis with a small diverticular abscess.

Findings discussed with Dr. Adursh.

## 2014-02-17 ENCOUNTER — Other Ambulatory Visit: Payer: Self-pay | Admitting: Surgical

## 2014-02-20 NOTE — Patient Instructions (Addendum)
Antonio Berg  02/20/2014   Your procedure is scheduled on: 03/04/14   Report to [facility]  Entrance and follow signs to               Short Stay Center at 9:00 AM.   Call this number if you have problems the morning of surgery [phone]   Remember:  Do not eat food or drink liquids :After Midnight.     Take these medicines the morning of surgery with A SIP OF WATER: AMLODIPINE / OPANA                               You may not have any metal on your body including hair pins and              piercings  Do not wear jewelry, make-up, lotions, powders or perfumes.             Do not wear nail polish.  Do not shave  48 hours prior to surgery.              Men may shave face and neck.   Do not bring valuables to the hospital. [facility] IS NOT             RESPONSIBLE   FOR VALUABLES.  Contacts, dentures or bridgework may not be worn into surgery.  Leave suitcase in the car. After surgery it may be brought to your room.     Patients discharged the day of surgery will not be allowed to drive home.  Name and phone number of your driver:  Special Instructions: N/A              Please read over the following fact sheets you were given: _____________________________________________________________________                                                      - PREPARING FOR SURGERY  Before surgery, you can play an important role.  Because skin is not sterile, your skin needs to be as free of germs as possible.  You can reduce the number of germs on your skin by washing with CHG (chlorahexidine gluconate) soap before surgery.  CHG is an antiseptic cleaner which kills germs and bonds with the skin to continue killing germs even after washing. Please DO NOT use if you have an allergy to CHG or antibacterial soaps.  If your skin becomes reddened/irritated stop using the CHG and inform your nurse when you arrive at Short Stay. Do not shave (including  legs and underarms) for at least 48 hours prior to the first CHG shower.  You may shave your face. Please follow these instructions carefully:   1.  Shower with CHG Soap the night before surgery and the  morning of Surgery.   2.  If you choose to wash your hair, wash your hair first as usual with your  normal  Shampoo.   3.  After you shampoo, rinse your hair and body thoroughly to remove the  shampoo.  4.  Use CHG as you would any other liquid soap.  You can apply chg directly  to the skin and wash . Gently wash with scrungie or clean wascloth    5.  Apply the CHG Soap to your body ONLY FROM THE NECK DOWN.   Do not use on open                           Wound or open sores. Avoid contact with eyes, ears mouth and genitals (private parts).                        Genitals (private parts) with your normal soap.              6.  Wash thoroughly, paying special attention to the area where your surgery  will be performed.   7.  Thoroughly rinse your body with warm water from the neck down.   8.  DO NOT shower/wash with your normal soap after using and rinsing off  the CHG Soap .                9.  Pat yourself dry with a clean towel.             10.  Wear clean pajamas.             11.  Place clean sheets on your bed the night of your first shower and do not  sleep with pets.  Day of Surgery : Do not apply any lotions/deodorants the morning of surgery.  Please wear clean clothes to the hospital/surgery center.  FAILURE TO FOLLOW THESE INSTRUCTIONS MAY RESULT IN THE CANCELLATION OF YOUR SURGERY    PATIENT SIGNATURE_________________________________  ______________________________________________________________________     Adam Phenix  An incentive spirometer is a tool that can help keep your lungs clear and active. This tool measures how well you are filling your lungs with each breath. Taking long deep breaths may help reverse or  decrease the chance of developing breathing (pulmonary) problems (especially infection) following:  A long period of time when you are unable to move or be active. BEFORE THE PROCEDURE   If the spirometer includes an indicator to show your best effort, your nurse or respiratory therapist will set it to a desired goal.  If possible, sit up straight or lean slightly forward. Try not to slouch.  Hold the incentive spirometer in an upright position. INSTRUCTIONS FOR USE   Sit on the edge of your bed if possible, or sit up as far as you can in bed or on a chair.  Hold the incentive spirometer in an upright position.  Breathe out normally.  Place the mouthpiece in your mouth and seal your lips tightly around it.  Breathe in slowly and as deeply as possible, raising the piston or the ball toward the top of the column.  Hold your breath for 3-5 seconds or for as long as possible. Allow the piston or ball to fall to the bottom of the column.  Remove the mouthpiece from your mouth and breathe out normally.  Rest for a few seconds and repeat Steps 1 through 7 at least 10 times every 1-2 hours when you are awake. Take your time and take a few normal breaths between deep breaths.  The spirometer may include an indicator to show your best effort. Use the indicator as a goal to work toward during  each repetition.  After each set of 10 deep breaths, practice coughing to be sure your lungs are clear. If you have an incision (the cut made at the time of surgery), support your incision when coughing by placing a pillow or rolled up towels firmly against it. Once you are able to get out of bed, walk around indoors and cough well. You may stop using the incentive spirometer when instructed by your caregiver.  RISKS AND COMPLICATIONS  Take your time so you do not get dizzy or light-headed.  If you are in pain, you may need to take or ask for pain medication before doing incentive spirometry. It is  harder to take a deep breath if you are having pain. AFTER USE  Rest and breathe slowly and easily.  It can be helpful to keep track of a log of your progress. Your caregiver can provide you with a simple table to help with this. If you are using the spirometer at home, follow these instructions: [place] IF:   You are having difficultly using the spirometer.  You have trouble using the spirometer as often as instructed.  Your pain medication is not giving enough relief while using the spirometer.  You develop fever of 100.5 F (38.1 C) or higher. SEEK IMMEDIATE MEDICAL CARE IF:   You cough up bloody sputum that had not been present before.  You develop fever of 102 F (38.9 C) or greater.  You develop worsening pain at or near the incision site. MAKE SURE YOU:   Understand these instructions.  Will watch your condition.  Will get help right away if you are not doing well or get worse. Document Released: 05/15/2006 Document Revised: 03/27/2011 Document Reviewed: 07/16/2006 [employer] Patient Information 2014 [employer], [place].   ________________________________________________________________________  WHAT IS A BLOOD TRANSFUSION? Blood Transfusion Information  A transfusion is the replacement of blood or some of its parts. Blood is made up of multiple cells which provide different functions.  Red blood cells carry oxygen and are used for blood loss replacement.  White blood cells fight against infection.  Platelets control bleeding.  Plasma helps clot blood.  Other blood products are available for specialized needs, such as hemophilia or other clotting disorders. BEFORE THE TRANSFUSION  Who gives blood for transfusions?   Healthy volunteers who are fully evaluated to make sure their blood is safe. This is blood bank blood. Transfusion therapy is the safest it has ever been in the practice of medicine. Before blood is taken from a donor, a complete history  is taken to make sure that person has no history of diseases nor engages in risky social behavior (examples are intravenous drug use or sexual activity with multiple partners). The donor's travel history is screened to minimize risk of transmitting infections, such as malaria. The donated blood is tested for signs of infectious diseases, such as HIV and hepatitis. The blood is then tested to be sure it is compatible with you in order to minimize the chance of a transfusion reaction. If you or a relative donates blood, this is often done in anticipation of surgery and is not appropriate for emergency situations. It takes many days to process the donated blood. RISKS AND COMPLICATIONS Although transfusion therapy is very safe and saves many lives, the main dangers of transfusion include:   Getting an infectious disease.  Developing a transfusion reaction. This is an allergic reaction to something in the blood you were given. Every precaution is taken to prevent  this. The decision to have a blood transfusion has been considered carefully by your caregiver before blood is given. Blood is not given unless the benefits outweigh the risks. AFTER THE TRANSFUSION  Right after receiving a blood transfusion, you will usually feel much better and more energetic. This is especially true if your red blood cells have gotten low (anemic). The transfusion raises the level of the red blood cells which carry oxygen, and this usually causes an energy increase.  The nurse administering the transfusion will monitor you carefully for complications. HOME CARE INSTRUCTIONS  No special instructions are needed after a transfusion. You may find your energy is better. Speak with your caregiver about any limitations on activity for underlying diseases you may have. SEEK MEDICAL CARE IF:   Your condition is not improving after your transfusion.  You develop redness or irritation at the intravenous (IV) site. SEEK IMMEDIATE  MEDICAL CARE IF:  Any of the following symptoms occur over the next 12 hours:  Shaking chills.  You have a temperature by mouth above 102 F (38.9 C), not controlled by medicine.  Chest, back, or muscle pain.  People around you feel you are not acting correctly or are confused.  Shortness of breath or difficulty breathing.  Dizziness and fainting.  You get a rash or develop hives.  You have a decrease in urine output.  Your urine turns a dark color or changes to pink, red, or brown. Any of the following symptoms occur over the next 10 days:  You have a temperature by mouth above 102 F (38.9 C), not controlled by medicine.  Shortness of breath.  Weakness after normal activity.  The white part of the eye turns yellow (jaundice).  You have a decrease in the amount of urine or are urinating less often.  Your urine turns a dark color or changes to pink, red, or brown. Document Released: 12/31/1999 Document Revised: 03/27/2011 Document Reviewed: 08/19/2007 [facility] Patient Information 2014 [place], [place].  _______________________________________________________________________

## 2014-02-23 ENCOUNTER — Ambulatory Visit (HOSPITAL_COMMUNITY)
Admission: RE | Admit: 2014-02-23 | Discharge: 2014-02-23 | Disposition: A | Discharge status: Home / Self Care | Payer: BLUE CROSS/BLUE SHIELD | Source: Ambulatory Visit | Attending: Surgical | Admitting: Surgical

## 2014-02-23 ENCOUNTER — Encounter (HOSPITAL_COMMUNITY): Payer: Self-pay

## 2014-02-23 ENCOUNTER — Encounter (HOSPITAL_COMMUNITY)
Admission: RE | Admit: 2014-02-23 | Discharge: 2014-02-23 | Disposition: A | Discharge status: Home / Self Care | Payer: BLUE CROSS/BLUE SHIELD | Source: Ambulatory Visit | Attending: Orthopedic Surgery | Admitting: Orthopedic Surgery

## 2014-02-23 DIAGNOSIS — Z01818 Encounter for other preprocedural examination: Secondary | ICD-10-CM | POA: Insufficient documentation

## 2014-02-23 HISTORY — DX: Personal history of other diseases of the musculoskeletal system and connective tissue: Z87.39

## 2014-02-23 LAB — SURGICAL PCR SCREEN
MRSA, PCR: NEGATIVE
STAPHYLOCOCCUS AUREUS: NEGATIVE

## 2014-02-23 LAB — COMPREHENSIVE METABOLIC PANEL
ALT: 17 U/L (ref 0–53)
AST: 22 U/L (ref 0–37)
Albumin: 4.3 g/dL (ref 3.5–5.2)
Alkaline Phosphatase: 75 U/L (ref 39–117)
Anion gap: 6 (ref 5–15)
BUN: 11 mg/dL (ref 6–23)
CO2: 32 mmol/L (ref 19–32)
Calcium: 9.7 mg/dL (ref 8.4–10.5)
Chloride: 101 mmol/L (ref 96–112)
Creatinine, Ser: 0.8 mg/dL (ref 0.50–1.35)
GFR calc Af Amer: >90 mL/min (ref >90)
GFR calc non Af Amer: >90 mL/min (ref >90)
Glucose, Bld: 76 mg/dL (ref 70–99)
Potassium: 3.4 mmol/L — ABNORMAL LOW (ref 3.5–5.1)
Sodium: 139 mmol/L (ref 135–145)
Total Bilirubin: 0.7 mg/dL (ref 0.3–1.2)
Total Protein: 7.9 g/dL (ref 6.0–8.3)

## 2014-02-23 LAB — URINALYSIS, ROUTINE W REFLEX MICROSCOPIC
Bilirubin Urine: NEGATIVE
Glucose, UA: NEGATIVE mg/dL
Hgb urine dipstick: NEGATIVE
Ketones, ur: NEGATIVE mg/dL
Leukocytes, UA: NEGATIVE
Nitrite: NEGATIVE
Protein, ur: NEGATIVE mg/dL
Specific Gravity, Urine: 1.009 (ref 1.005–1.030)
Urobilinogen, UA: 0.2 mg/dL (ref 0.0–1.0)
pH: 8 (ref 5.0–8.0)

## 2014-02-23 LAB — PROTIME-INR
INR: 1.06 (ref 0.00–1.49)
Prothrombin Time: 13.9 seconds (ref 11.6–15.2)

## 2014-02-23 LAB — APTT: aPTT: 31 seconds (ref 24–37)

## 2014-02-23 NOTE — Progress Notes (Signed)
EKG / CBC / clearance from office on chart

## 2014-02-27 NOTE — H&P (Signed)
TOTAL KNEE ADMISSION H&P  Patient is being admitted for left total knee arthroplasty.  Subjective:  Chief Complaint:left knee pain.  HPI: Antonio Berg, 59 y.o. male, has a history of pain and functional disability in the left knee due to arthritis and has failed non-surgical conservative treatments for greater than 12 weeks to includeNSAID's and/or analgesics, corticosteriod injections and activity modification.  Onset of symptoms was gradual, starting 5 years ago with gradually worsening course since that time. The patient noted prior procedures on the knee to include  arthroscopy and menisectomy on the left knee(s).  Patient currently rates pain in the left knee(s) at 7 out of 10 with activity. Patient has night pain, worsening of pain with activity and weight bearing, pain that interferes with activities of daily living, pain with passive range of motion, crepitus and joint swelling.  Patient has evidence of periarticular osteophytes and joint space narrowing by imaging studies.  There is no active infection.  Patient Active Problem List   Diagnosis Date Noted  . Acute diverticulitis 04/15/2012  . Postoperative anemia due to acute blood loss 04/12/2012  . Osteoarthritis of right hip 04/11/2012   Past Medical History  Diagnosis Date  . Hypertension   . Sleep apnea     not used since 2011   . Arthritis   . History of gout     Past Surgical History  Procedure Laterality Date  . Knee arthroscopy      bilateral   . Total hip arthroplasty Right 04/11/2012    Procedure: TOTAL HIP ARTHROPLASTY;  Surgeon: Jacki Conesonald A Gioffre, MD;  Location: [facility];  Service: Orthopedics;  Laterality: Right;    Current outpatient prescriptions:  .  amLODipine (NORVASC) 10 MG tablet, Take 10 mg by mouth daily before breakfast., Disp: , Rfl:  .  oxymorphone (OPANA ER) 30 MG 12 hr tablet, Take 30 mg by mouth every 12 (twelve) hours. , Disp: , Rfl:  .  triamterene-hydrochlorothiazide (DYAZIDE) 37.5-25 MG per  capsule, Take 1 capsule by mouth 2 (two) times daily., Disp: , Rfl:  .  allopurinol (ZYLOPRIM) 100 MG tablet, Take 100 mg by mouth 2 (two) times daily., Disp: , Rfl:  .  amoxicillin-clavulanate (AUGMENTIN) 875-125 MG per tablet, Take 1 tablet by mouth every 12 (twelve) hours. (Patient not taking: Reported on 02/23/2014), Disp: 30 tablet, Rfl: 0 .  ferrous sulfate 325 (65 FE) MG tablet, Take 1 tablet (325 mg total) by mouth 3 (three) times daily after meals., Disp: 45 tablet, Rfl: 0 .  Menthol, Topical Analgesic, (BENGAY EX), Apply 1 application topically daily as needed (For pain.)., Disp: , Rfl:  .  methocarbamol (ROBAXIN) 500 MG tablet, Take 1 tablet (500 mg total) by mouth every 6 (six) hours as needed., Disp: 40 tablet, Rfl: 1 .  oxyCODONE (OXY IR/ROXICODONE) 5 MG immediate release tablet, Take 1-3 tablets (5-15 mg total) by mouth every 4 (four) hours as needed., Disp: 80 tablet, Rfl: 0 .  potassium chloride SA (K-DUR,KLOR-CON) 20 MEQ tablet, Take 20 mEq by mouth 2 (two) times daily., Disp: , Rfl:  .  rivaroxaban (XARELTO) 10 MG TABS tablet, Take 1 tablet (10 mg total) by mouth daily., Disp: 15 tablet, Rfl: 0 .  sildenafil (VIAGRA) 100 MG tablet, Take 100 mg by mouth daily as needed for erectile dysfunction. , Disp: , Rfl:    No Known Allergies  History  Substance Use Topics  . Smoking status: Never Smoker   . Smokeless tobacco: Never Used  . Alcohol Use:  No    Family History Mother: HTN, osteoporosis   Review of Systems  Constitutional: Positive for malaise/fatigue. Negative for fever, chills, weight loss and diaphoresis.  HENT: Negative.   Eyes: Negative.   Respiratory: Positive for shortness of breath. Negative for cough, hemoptysis, sputum production and wheezing.        SOB with exertion  Cardiovascular: Negative.   Gastrointestinal: Negative.   Genitourinary: Negative.   Musculoskeletal: Positive for back pain and joint pain. Negative for myalgias, falls and neck pain.        Bilateral knee pain  Neurological: Negative.  Negative for weakness.  Endo/Heme/Allergies: Negative.   Psychiatric/Behavioral: Negative.     Objective:  Physical Exam  Constitutional: He is oriented to person, place, and time. He appears well-developed. No distress.  Obese  HENT:  Head: Normocephalic.  Right Ear: External ear normal.  Left Ear: External ear normal.  Nose: Nose normal.  Mouth/Throat: Oropharynx is clear and moist.  Eyes: Conjunctivae and EOM are normal.  Neck: Normal range of motion. Neck supple.  Cardiovascular: Normal rate, regular rhythm, normal heart sounds and intact distal pulses.   No murmur heard. Respiratory: Effort normal and breath sounds normal. No respiratory distress. He has no wheezes.  GI: Soft. Bowel sounds are normal. He exhibits no distension. There is no tenderness.  Musculoskeletal:       Right hip: Normal.       Left hip: Normal.       Right knee: He exhibits decreased range of motion and swelling. He exhibits no effusion and no erythema. Tenderness found. Medial joint line and lateral joint line tenderness noted.       Left knee: He exhibits decreased range of motion, swelling and effusion. He exhibits no erythema. Tenderness found. Medial joint line and lateral joint line tenderness noted.       Right lower leg: He exhibits no tenderness and no swelling.       Left lower leg: He exhibits no tenderness and no swelling.  Neurological: He is alert and oriented to person, place, and time. He has normal strength and normal reflexes. No sensory deficit.  Skin: No rash noted. He is not diaphoretic. No erythema.  Psychiatric: He has a normal mood and affect. His behavior is normal.    Vitals Weight: 280 lb Height: 73in Body Surface Area: 2.48 m Body Mass Index: 36.94 kg/m  Pulse: 80 (Regular)  BP: 120/74 (Sitting, Left Arm, Standard)  Imaging Review Plain radiographs demonstrate severe degenerative joint disease of the left knee(s).  The overall alignment ismild varus. The bone quality appears to be good for age and reported activity level.  Assessment/Plan:  End stage primary osteoarthritis, left knee   The patient history, physical examination, clinical judgment of the provider and imaging studies are consistent with end stage degenerative joint disease of the left knee(s) and total knee arthroplasty is deemed medically necessary. The treatment options including medical management, injection therapy arthroscopy and arthroplasty were discussed at length. The risks and benefits of total knee arthroplasty were presented and reviewed. The risks due to aseptic loosening, infection, stiffness, patella tracking problems, thromboembolic complications and other imponderables were discussed. The patient acknowledged the explanation, agreed to proceed with the plan and consent was signed. Patient is being admitted for inpatient treatment for surgery, pain control, PT, OT, prophylactic antibiotics, VTE prophylaxis, progressive ambulation and ADL's and discharge planning. The patient is planning to be discharged home with home health services   TXA IV PCP: Dr.  John [employer] [place]

## 2014-03-03 MED ORDER — DEXTROSE 5 % IV SOLN
3.0000 g | INTRAVENOUS | Status: AC
  Administered 2014-03-04: 3 g via INTRAVENOUS
  Filled 2014-03-03: qty 3000

## 2014-03-04 ENCOUNTER — Inpatient Hospital Stay (HOSPITAL_COMMUNITY): Payer: BLUE CROSS/BLUE SHIELD | Admitting: Anesthesiology

## 2014-03-04 ENCOUNTER — Inpatient Hospital Stay (HOSPITAL_COMMUNITY)
Admission: RE | Admit: 2014-03-04 | Discharge: 2014-03-06 | DRG: 470 | Disposition: A | Discharge status: Home Health | Payer: BLUE CROSS/BLUE SHIELD | Source: Ambulatory Visit | Attending: Orthopedic Surgery | Admitting: Orthopedic Surgery

## 2014-03-04 ENCOUNTER — Encounter (HOSPITAL_COMMUNITY)
Admission: RE | Disposition: A | Discharge status: Home Health | Payer: Self-pay | Source: Ambulatory Visit | Attending: Orthopedic Surgery

## 2014-03-04 ENCOUNTER — Encounter (HOSPITAL_COMMUNITY): Payer: Self-pay | Admitting: General Practice

## 2014-03-04 DIAGNOSIS — M24562 Contracture, left knee: Secondary | ICD-10-CM | POA: Diagnosis present

## 2014-03-04 DIAGNOSIS — I10 Essential (primary) hypertension: Secondary | ICD-10-CM | POA: Diagnosis present

## 2014-03-04 DIAGNOSIS — Z96659 Presence of unspecified artificial knee joint: Secondary | ICD-10-CM

## 2014-03-04 DIAGNOSIS — Z96649 Presence of unspecified artificial hip joint: Secondary | ICD-10-CM | POA: Diagnosis present

## 2014-03-04 DIAGNOSIS — M1712 Unilateral primary osteoarthritis, left knee: Secondary | ICD-10-CM | POA: Diagnosis present

## 2014-03-04 DIAGNOSIS — M25562 Pain in left knee: Secondary | ICD-10-CM | POA: Diagnosis present

## 2014-03-04 HISTORY — PX: TOTAL KNEE ARTHROPLASTY: SHX125

## 2014-03-04 LAB — CBC
HCT: 42.3 % (ref 39.0–52.0)
Hemoglobin: 13.8 g/dL (ref 13.0–17.0)
MCH: 28.2 pg (ref 26.0–34.0)
MCHC: 32.6 g/dL (ref 30.0–36.0)
MCV: 86.5 fL (ref 78.0–100.0)
PLATELETS: 267 10*3/uL (ref 150–400)
RBC: 4.89 MIL/uL (ref 4.22–5.81)
RDW: 14.1 % (ref 11.5–15.5)
WBC: 7.3 10*3/uL (ref 4.0–10.5)

## 2014-03-04 LAB — TYPE AND SCREEN
ABO/RH(D): AB POS
Antibody Screen: NEGATIVE

## 2014-03-04 SURGERY — ARTHROPLASTY, KNEE, TOTAL
Anesthesia: General | Site: Knee | Laterality: Left

## 2014-03-04 MED ORDER — POTASSIUM CHLORIDE CRYS ER 20 MEQ PO TBCR
20.0000 meq | EXTENDED_RELEASE_TABLET | Freq: Two times a day (BID) | ORAL | Status: DC
  Administered 2014-03-04 – 2014-03-06 (×4): 20 meq via ORAL
  Filled 2014-03-04 (×5): qty 1

## 2014-03-04 MED ORDER — CISATRACURIUM BESYLATE 20 MG/10ML IV SOLN
INTRAVENOUS | Status: AC
  Filled 2014-03-04: qty 10

## 2014-03-04 MED ORDER — RIVAROXABAN 10 MG PO TABS
10.0000 mg | ORAL_TABLET | Freq: Every day | ORAL | Status: DC
  Administered 2014-03-05 – 2014-03-06 (×2): 10 mg via ORAL
  Filled 2014-03-04 (×4): qty 1

## 2014-03-04 MED ORDER — POLYETHYLENE GLYCOL 3350 17 G PO PACK
17.0000 g | PACK | Freq: Every day | ORAL | Status: DC | PRN
Start: 2014-03-04 — End: 2014-03-06

## 2014-03-04 MED ORDER — PROMETHAZINE HCL 25 MG/ML IJ SOLN: 12.5000 mg | Freq: Four times a day (QID) | INTRAMUSCULAR | Status: DC | PRN

## 2014-03-04 MED ORDER — CISATRACURIUM BESYLATE (PF) 10 MG/5ML IV SOLN
INTRAVENOUS | Status: DC | PRN
  Administered 2014-03-04: 10 mg via INTRAVENOUS
  Administered 2014-03-04: 2 mg via INTRAVENOUS

## 2014-03-04 MED ORDER — HYDROMORPHONE HCL 1 MG/ML IJ SOLN
INTRAMUSCULAR | Status: AC
  Filled 2014-03-04: qty 1

## 2014-03-04 MED ORDER — THROMBIN 5000 UNITS EX SOLR
CUTANEOUS | Status: AC
  Filled 2014-03-04: qty 5000

## 2014-03-04 MED ORDER — SODIUM CHLORIDE 0.9 % IJ SOLN
INTRAMUSCULAR | Status: DC | PRN
  Administered 2014-03-04: 20 mL via INTRAVENOUS

## 2014-03-04 MED ORDER — HYDROMORPHONE HCL 1 MG/ML IJ SOLN
INTRAMUSCULAR | Status: AC
Start: 2014-03-04 — End: 2014-03-05
  Filled 2014-03-04: qty 1

## 2014-03-04 MED ORDER — SUFENTANIL CITRATE 50 MCG/ML IV SOLN
INTRAVENOUS | Status: AC
  Filled 2014-03-04: qty 1

## 2014-03-04 MED ORDER — BUPIVACAINE LIPOSOME 1.3 % IJ SUSP
20.0000 mL | Freq: Once | INTRAMUSCULAR | Status: AC
  Administered 2014-03-04: 20 mL
  Filled 2014-03-04: qty 20

## 2014-03-04 MED ORDER — BUPIVACAINE HCL (PF) 0.25 % IJ SOLN
INTRAMUSCULAR | Status: DC | PRN
  Administered 2014-03-04: 20 mL

## 2014-03-04 MED ORDER — GLYCOPYRROLATE 0.2 MG/ML IJ SOLN
INTRAMUSCULAR | Status: AC
  Filled 2014-03-04: qty 4

## 2014-03-04 MED ORDER — ACETAMINOPHEN 650 MG RE SUPP: 650.0000 mg | Freq: Four times a day (QID) | RECTAL | Status: DC | PRN

## 2014-03-04 MED ORDER — MIDAZOLAM HCL 2 MG/2ML IJ SOLN
INTRAMUSCULAR | Status: AC
  Filled 2014-03-04: qty 2

## 2014-03-04 MED ORDER — SUCCINYLCHOLINE CHLORIDE 20 MG/ML IJ SOLN
INTRAMUSCULAR | Status: DC | PRN
  Administered 2014-03-04: 160 mg via INTRAVENOUS

## 2014-03-04 MED ORDER — KETOROLAC TROMETHAMINE 30 MG/ML IJ SOLN
30.0000 mg | Freq: Once | INTRAMUSCULAR | Status: AC | PRN
  Administered 2014-03-04: 30 mg via INTRAVENOUS

## 2014-03-04 MED ORDER — HYDROMORPHONE HCL 1 MG/ML IJ SOLN
1.0000 mg | INTRAMUSCULAR | Status: DC | PRN
Start: 2014-03-04 — End: 2014-03-06
  Administered 2014-03-04 – 2014-03-05 (×4): 1 mg via INTRAVENOUS
  Filled 2014-03-04 (×4): qty 1

## 2014-03-04 MED ORDER — HYDROMORPHONE HCL 2 MG/ML IJ SOLN
INTRAMUSCULAR | Status: AC
  Filled 2014-03-04: qty 1

## 2014-03-04 MED ORDER — OXYCODONE-ACETAMINOPHEN 5-325 MG PO TABS: 2.0000 | ORAL_TABLET | ORAL | Status: DC | PRN

## 2014-03-04 MED ORDER — PHENOL 1.4 % MT LIQD
1.0000 | OROMUCOSAL | Status: DC | PRN
  Filled 2014-03-04: qty 177

## 2014-03-04 MED ORDER — NEOSTIGMINE METHYLSULFATE 10 MG/10ML IV SOLN
INTRAVENOUS | Status: AC
  Filled 2014-03-04: qty 1

## 2014-03-04 MED ORDER — POLYMYXIN B SULFATE 500000 UNITS IJ SOLR
INTRAMUSCULAR | Status: DC | PRN
  Administered 2014-03-04: 500 mL

## 2014-03-04 MED ORDER — THROMBIN 5000 UNITS EX SOLR
OROMUCOSAL | Status: DC | PRN
  Administered 2014-03-04: 12:00:00 via TOPICAL

## 2014-03-04 MED ORDER — BUPIVACAINE HCL (PF) 0.25 % IJ SOLN
INTRAMUSCULAR | Status: AC
  Filled 2014-03-04: qty 30

## 2014-03-04 MED ORDER — KETOROLAC TROMETHAMINE 30 MG/ML IJ SOLN
INTRAMUSCULAR | Status: AC
  Filled 2014-03-04: qty 1

## 2014-03-04 MED ORDER — ROPIVACAINE HCL 5 MG/ML IJ SOLN
INTRAMUSCULAR | Status: AC
  Filled 2014-03-04: qty 30

## 2014-03-04 MED ORDER — OXYCODONE HCL 5 MG PO TABS
10.0000 mg | ORAL_TABLET | ORAL | Status: DC | PRN
  Administered 2014-03-04 (×2): 20 mg via ORAL
  Administered 2014-03-04: 10 mg via ORAL
  Administered 2014-03-05 – 2014-03-06 (×10): 20 mg via ORAL
  Filled 2014-03-04 (×11): qty 4
  Filled 2014-03-04: qty 2
  Filled 2014-03-04: qty 4

## 2014-03-04 MED ORDER — DEXAMETHASONE SODIUM PHOSPHATE 10 MG/ML IJ SOLN
INTRAMUSCULAR | Status: DC | PRN
  Administered 2014-03-04: 10 mg via INTRAVENOUS

## 2014-03-04 MED ORDER — MEPERIDINE HCL 50 MG/ML IJ SOLN
6.2500 mg | INTRAMUSCULAR | Status: DC | PRN
  Administered 2014-03-04 (×2): 12.5 mg via INTRAVENOUS

## 2014-03-04 MED ORDER — HYDROCODONE-ACETAMINOPHEN 5-325 MG PO TABS: 1.0000 | ORAL_TABLET | ORAL | Status: DC | PRN

## 2014-03-04 MED ORDER — BISACODYL 5 MG PO TBEC: 5.0000 mg | DELAYED_RELEASE_TABLET | Freq: Every day | ORAL | Status: DC | PRN

## 2014-03-04 MED ORDER — DEXAMETHASONE SODIUM PHOSPHATE 10 MG/ML IJ SOLN
INTRAMUSCULAR | Status: AC
  Filled 2014-03-04: qty 1

## 2014-03-04 MED ORDER — ACETAMINOPHEN 10 MG/ML IV SOLN
1000.0000 mg | Freq: Once | INTRAVENOUS | Status: AC
  Administered 2014-03-04: 1000 mg via INTRAVENOUS
  Filled 2014-03-04: qty 100

## 2014-03-04 MED ORDER — HYDROMORPHONE HCL 1 MG/ML IJ SOLN
0.2500 mg | INTRAMUSCULAR | Status: DC | PRN
  Administered 2014-03-04 (×4): 0.5 mg via INTRAVENOUS

## 2014-03-04 MED ORDER — SODIUM CHLORIDE 0.9 % IJ SOLN
INTRAMUSCULAR | Status: AC
  Filled 2014-03-04: qty 10

## 2014-03-04 MED ORDER — TRIAMTERENE-HCTZ 37.5-25 MG PO CAPS
1.0000 | ORAL_CAPSULE | Freq: Two times a day (BID) | ORAL | Status: DC
  Administered 2014-03-04 – 2014-03-05 (×3): 1 via ORAL
  Filled 2014-03-04 (×5): qty 1

## 2014-03-04 MED ORDER — PROMETHAZINE HCL 25 MG/ML IJ SOLN
INTRAMUSCULAR | Status: AC
  Filled 2014-03-04: qty 1

## 2014-03-04 MED ORDER — LIDOCAINE HCL (CARDIAC) 20 MG/ML IV SOLN
INTRAVENOUS | Status: AC
  Filled 2014-03-04: qty 5

## 2014-03-04 MED ORDER — EPHEDRINE SULFATE 50 MG/ML IJ SOLN
INTRAMUSCULAR | Status: AC
  Filled 2014-03-04: qty 1

## 2014-03-04 MED ORDER — SUFENTANIL CITRATE 50 MCG/ML IV SOLN
INTRAVENOUS | Status: DC | PRN
  Administered 2014-03-04: 15 ug via INTRAVENOUS
  Administered 2014-03-04: 10 ug via INTRAVENOUS
  Administered 2014-03-04: 5 ug via INTRAVENOUS
  Administered 2014-03-04 (×5): 10 ug via INTRAVENOUS

## 2014-03-04 MED ORDER — LIDOCAINE HCL (CARDIAC) 20 MG/ML IV SOLN
INTRAVENOUS | Status: DC | PRN
  Administered 2014-03-04: 100 mg via INTRAVENOUS

## 2014-03-04 MED ORDER — HYDROMORPHONE HCL 1 MG/ML IJ SOLN
INTRAMUSCULAR | Status: DC | PRN
  Administered 2014-03-04: .6 mg via INTRAVENOUS
  Administered 2014-03-04 (×2): .4 mg via INTRAVENOUS
  Administered 2014-03-04: .2 mg via INTRAVENOUS
  Administered 2014-03-04: .4 mg via INTRAVENOUS

## 2014-03-04 MED ORDER — FERROUS SULFATE 325 (65 FE) MG PO TABS
325.0000 mg | ORAL_TABLET | Freq: Three times a day (TID) | ORAL | Status: DC
  Administered 2014-03-05 – 2014-03-06 (×3): 325 mg via ORAL
  Filled 2014-03-04 (×8): qty 1

## 2014-03-04 MED ORDER — MIDAZOLAM HCL 5 MG/5ML IJ SOLN
INTRAMUSCULAR | Status: DC | PRN
  Administered 2014-03-04: 2 mg via INTRAVENOUS

## 2014-03-04 MED ORDER — LACTATED RINGERS IV SOLN
INTRAVENOUS | Status: DC
  Administered 2014-03-04: 14:00:00 via INTRAVENOUS

## 2014-03-04 MED ORDER — ONDANSETRON HCL 4 MG/2ML IJ SOLN
INTRAMUSCULAR | Status: DC | PRN
  Administered 2014-03-04: 4 mg via INTRAVENOUS

## 2014-03-04 MED ORDER — ONDANSETRON HCL 4 MG PO TABS: 4.0000 mg | ORAL_TABLET | Freq: Four times a day (QID) | ORAL | Status: DC | PRN

## 2014-03-04 MED ORDER — ALLOPURINOL 100 MG PO TABS
100.0000 mg | ORAL_TABLET | Freq: Two times a day (BID) | ORAL | Status: DC
  Administered 2014-03-04 – 2014-03-06 (×4): 100 mg via ORAL
  Filled 2014-03-04 (×5): qty 1

## 2014-03-04 MED ORDER — CEFAZOLIN SODIUM 1-5 GM-% IV SOLN
1.0000 g | Freq: Four times a day (QID) | INTRAVENOUS | Status: AC
  Administered 2014-03-04 – 2014-03-05 (×2): 1 g via INTRAVENOUS
  Filled 2014-03-04 (×2): qty 50

## 2014-03-04 MED ORDER — ACETAMINOPHEN 325 MG PO TABS: 650.0000 mg | ORAL_TABLET | Freq: Four times a day (QID) | ORAL | Status: DC | PRN

## 2014-03-04 MED ORDER — GLYCOPYRROLATE 0.2 MG/ML IJ SOLN
INTRAMUSCULAR | Status: DC | PRN
  Administered 2014-03-04: 0.6 mg via INTRAVENOUS

## 2014-03-04 MED ORDER — NEOSTIGMINE METHYLSULFATE 10 MG/10ML IV SOLN
INTRAVENOUS | Status: DC | PRN
  Administered 2014-03-04: 4 mg via INTRAVENOUS

## 2014-03-04 MED ORDER — METHOCARBAMOL 1000 MG/10ML IJ SOLN
500.0000 mg | Freq: Four times a day (QID) | INTRAVENOUS | Status: DC | PRN
  Administered 2014-03-04: 500 mg via INTRAVENOUS
  Filled 2014-03-04 (×2): qty 5

## 2014-03-04 MED ORDER — LACTATED RINGERS IV SOLN
INTRAVENOUS | Status: DC
  Administered 2014-03-04 (×3): via INTRAVENOUS

## 2014-03-04 MED ORDER — FERROUS SULFATE 325 (65 FE) MG PO TABS: 325.0000 mg | ORAL_TABLET | Freq: Three times a day (TID) | ORAL | Status: DC

## 2014-03-04 MED ORDER — ALUM & MAG HYDROXIDE-SIMETH 200-200-20 MG/5ML PO SUSP: 30.0000 mL | ORAL | Status: DC | PRN

## 2014-03-04 MED ORDER — CELECOXIB 200 MG PO CAPS
200.0000 mg | ORAL_CAPSULE | Freq: Two times a day (BID) | ORAL | Status: DC
  Administered 2014-03-04 – 2014-03-06 (×4): 200 mg via ORAL
  Filled 2014-03-04 (×5): qty 1

## 2014-03-04 MED ORDER — FLEET ENEMA 7-19 GM/118ML RE ENEM: 1.0000 | ENEMA | Freq: Once | RECTAL | Status: AC | PRN

## 2014-03-04 MED ORDER — AMLODIPINE BESYLATE 10 MG PO TABS
10.0000 mg | ORAL_TABLET | Freq: Every day | ORAL | Status: DC
  Administered 2014-03-05 – 2014-03-06 (×2): 10 mg via ORAL
  Filled 2014-03-04 (×2): qty 1

## 2014-03-04 MED ORDER — KETAMINE HCL 10 MG/ML IJ SOLN
INTRAMUSCULAR | Status: DC | PRN
  Administered 2014-03-04: 30 mg via INTRAVENOUS
  Administered 2014-03-04 (×3): 10 mg via INTRAVENOUS

## 2014-03-04 MED ORDER — METHOCARBAMOL 500 MG PO TABS
500.0000 mg | ORAL_TABLET | Freq: Four times a day (QID) | ORAL | Status: DC | PRN
  Administered 2014-03-04 – 2014-03-06 (×5): 500 mg via ORAL
  Filled 2014-03-04 (×6): qty 1

## 2014-03-04 MED ORDER — CHLORHEXIDINE GLUCONATE 4 % EX LIQD: 60.0000 mL | Freq: Once | CUTANEOUS | Status: DC

## 2014-03-04 MED ORDER — SODIUM CHLORIDE 0.9 % IJ SOLN
INTRAMUSCULAR | Status: AC
  Filled 2014-03-04: qty 20

## 2014-03-04 MED ORDER — LACTATED RINGERS IV SOLN
INTRAVENOUS | Status: DC
  Administered 2014-03-04: 23:00:00 via INTRAVENOUS

## 2014-03-04 MED ORDER — PROPOFOL 10 MG/ML IV BOLUS
INTRAVENOUS | Status: DC | PRN
  Administered 2014-03-04: 200 mg via INTRAVENOUS

## 2014-03-04 MED ORDER — OXYMORPHONE HCL ER 10 MG PO T12A
30.0000 mg | EXTENDED_RELEASE_TABLET | Freq: Two times a day (BID) | ORAL | Status: DC
Start: 2014-03-04 — End: 2014-03-06
  Administered 2014-03-04 – 2014-03-06 (×4): 30 mg via ORAL
  Filled 2014-03-04 (×4): qty 3

## 2014-03-04 MED ORDER — MENTHOL 3 MG MT LOZG
1.0000 | LOZENGE | OROMUCOSAL | Status: DC | PRN
  Filled 2014-03-04: qty 9

## 2014-03-04 MED ORDER — ONDANSETRON HCL 4 MG/2ML IJ SOLN: 4.0000 mg | Freq: Four times a day (QID) | INTRAMUSCULAR | Status: DC | PRN

## 2014-03-04 MED ORDER — KETAMINE HCL 10 MG/ML IJ SOLN
INTRAMUSCULAR | Status: AC
Start: 2014-03-04 — End: 2014-03-04
  Filled 2014-03-04: qty 1

## 2014-03-04 MED ORDER — PROMETHAZINE HCL 25 MG PO TABS: 12.5000 mg | ORAL_TABLET | Freq: Four times a day (QID) | ORAL | Status: DC | PRN

## 2014-03-04 MED ORDER — PROMETHAZINE HCL 25 MG/ML IJ SOLN
6.2500 mg | INTRAMUSCULAR | Status: AC | PRN
  Administered 2014-03-04 (×2): 12.5 mg via INTRAVENOUS

## 2014-03-04 MED ORDER — PROPOFOL 10 MG/ML IV BOLUS
INTRAVENOUS | Status: AC
Start: 2014-03-04 — End: 2014-03-04
  Filled 2014-03-04: qty 20

## 2014-03-04 MED ORDER — MEPERIDINE HCL 50 MG/ML IJ SOLN
INTRAMUSCULAR | Status: AC
  Filled 2014-03-04: qty 1

## 2014-03-04 MED ORDER — ONDANSETRON HCL 4 MG/2ML IJ SOLN
INTRAMUSCULAR | Status: AC
  Filled 2014-03-04: qty 2

## 2014-03-04 MED ORDER — TRANEXAMIC ACID 100 MG/ML IV SOLN
1000.0000 mg | INTRAVENOUS | Status: AC
  Administered 2014-03-04: 1000 mg via INTRAVENOUS
  Filled 2014-03-04: qty 10

## 2014-03-04 SURGICAL SUPPLY — 70 items
BAG ZIPLOCK 12X15 (MISCELLANEOUS) IMPLANT
BANDAGE ELASTIC 4 VELCRO ST LF (GAUZE/BANDAGES/DRESSINGS) ×3 IMPLANT
BANDAGE ELASTIC 6 VELCRO ST LF (GAUZE/BANDAGES/DRESSINGS) IMPLANT
BANDAGE ESMARK 6X9 LF (GAUZE/BANDAGES/DRESSINGS) ×1 IMPLANT
BLADE SAG 18X100X1.27 (BLADE) ×3 IMPLANT
BLADE SAW SGTL 11.0X1.19X90.0M (BLADE) ×3 IMPLANT
BNDG ELASTIC 6X10 VLCR STRL LF (GAUZE/BANDAGES/DRESSINGS) ×3 IMPLANT
BNDG ESMARK 6X9 LF (GAUZE/BANDAGES/DRESSINGS) ×3
BONE CEMENT GENTAMICIN (Cement) ×6 IMPLANT
CAP KNEE TOTAL 3 SIGMA ×3 IMPLANT
CEMENT BONE GENTAMICIN 40 (Cement) ×2 IMPLANT
CUFF TOURN SGL QUICK 34 (TOURNIQUET CUFF) ×2
CUFF TRNQT CYL 34X4X40X1 (TOURNIQUET CUFF) ×1 IMPLANT
DRAPE EXTREMITY T 121X128X90 (DRAPE) ×3 IMPLANT
DRAPE INCISE IOBAN 66X45 STRL (DRAPES) ×3 IMPLANT
DRAPE POUCH INSTRU U-SHP 10X18 (DRAPES) ×3 IMPLANT
DRAPE U-SHAPE 47X51 STRL (DRAPES) ×3 IMPLANT
DRSG AQUACEL AG ADV 3.5X10 (GAUZE/BANDAGES/DRESSINGS) ×3 IMPLANT
DRSG PAD ABDOMINAL 8X10 ST (GAUZE/BANDAGES/DRESSINGS) IMPLANT
DRSG TEGADERM 4X4.75 (GAUZE/BANDAGES/DRESSINGS) ×3 IMPLANT
DURAPREP 26ML APPLICATOR (WOUND CARE) ×3 IMPLANT
ELECT REM PT RETURN 9FT ADLT (ELECTROSURGICAL) ×3
ELECTRODE REM PT RTRN 9FT ADLT (ELECTROSURGICAL) ×1 IMPLANT
EVACUATOR 1/8 PVC DRAIN (DRAIN) ×3 IMPLANT
FACESHIELD WRAPAROUND (MASK) ×15 IMPLANT
GAUZE SPONGE 2X2 8PLY STRL LF (GAUZE/BANDAGES/DRESSINGS) ×1 IMPLANT
GLOVE BIOGEL PI IND STRL 6.5 (GLOVE) ×1 IMPLANT
GLOVE BIOGEL PI IND STRL 8 (GLOVE) ×1 IMPLANT
GLOVE BIOGEL PI INDICATOR 6.5 (GLOVE) ×2
GLOVE BIOGEL PI INDICATOR 8 (GLOVE) ×2
GLOVE ECLIPSE 8.0 STRL XLNG CF (GLOVE) ×6 IMPLANT
GLOVE SURG SS PI 6.5 STRL IVOR (GLOVE) ×3 IMPLANT
GOWN STRL REUS W/TWL LRG LVL3 (GOWN DISPOSABLE) ×6 IMPLANT
GOWN STRL REUS W/TWL XL LVL3 (GOWN DISPOSABLE) ×6 IMPLANT
HANDPIECE INTERPULSE COAX TIP (DISPOSABLE) ×2
IMMOBILIZER KNEE 20 (SOFTGOODS) ×6 IMPLANT
IMMOBILIZER KNEE 20 THIGH 36 (SOFTGOODS) ×1 IMPLANT
KIT BASIN OR (CUSTOM PROCEDURE TRAY) ×3 IMPLANT
LIQUID BAND (GAUZE/BANDAGES/DRESSINGS) ×3 IMPLANT
MANIFOLD NEPTUNE II (INSTRUMENTS) ×3 IMPLANT
NDL SAFETY ECLIPSE 18X1.5 (NEEDLE) IMPLANT
NEEDLE HYPO 18GX1.5 SHARP (NEEDLE)
NEEDLE HYPO 22GX1.5 SAFETY (NEEDLE) ×6 IMPLANT
NS IRRIG 1000ML POUR BTL (IV SOLUTION) IMPLANT
PACK TOTAL JOINT (CUSTOM PROCEDURE TRAY) ×3 IMPLANT
PADDING CAST COTTON 6X4 STRL (CAST SUPPLIES) IMPLANT
PEN SKIN MARKING BROAD (MISCELLANEOUS) ×3 IMPLANT
POSITIONER SURGICAL ARM (MISCELLANEOUS) ×3 IMPLANT
SET HNDPC FAN SPRY TIP SCT (DISPOSABLE) ×1 IMPLANT
SET PAD KNEE POSITIONER (MISCELLANEOUS) ×3 IMPLANT
SPONGE GAUZE 2X2 STER 10/PKG (GAUZE/BANDAGES/DRESSINGS) ×2
SPONGE LAP 18X18 X RAY DECT (DISPOSABLE) ×3 IMPLANT
SPONGE SURGIFOAM ABS GEL 100 (HEMOSTASIS) ×3 IMPLANT
STAPLER VISISTAT 35W (STAPLE) IMPLANT
SUCTION FRAZIER 12FR DISP (SUCTIONS) ×3 IMPLANT
SUT BONE WAX W31G (SUTURE) ×3 IMPLANT
SUT MNCRL AB 4-0 PS2 18 (SUTURE) ×3 IMPLANT
SUT VIC AB 1 CT1 27 (SUTURE) ×4
SUT VIC AB 1 CT1 27XBRD ANTBC (SUTURE) ×2 IMPLANT
SUT VIC AB 2-0 CT1 27 (SUTURE) ×6
SUT VIC AB 2-0 CT1 TAPERPNT 27 (SUTURE) ×3 IMPLANT
SUT VLOC 180 0 24IN GS25 (SUTURE) ×3 IMPLANT
SYR 20CC LL (SYRINGE) ×6 IMPLANT
TOWEL OR 17X26 10 PK STRL BLUE (TOWEL DISPOSABLE) ×3 IMPLANT
TOWEL OR NON WOVEN STRL DISP B (DISPOSABLE) ×3 IMPLANT
TOWER CARTRIDGE SMART MIX (DISPOSABLE) ×3 IMPLANT
TRAY FOLEY CATH 14FRSI W/METER (CATHETERS) ×3 IMPLANT
WATER STERILE IRR 1500ML POUR (IV SOLUTION) ×3 IMPLANT
WRAP KNEE MAXI GEL POST OP (GAUZE/BANDAGES/DRESSINGS) ×3 IMPLANT
YANKAUER SUCT BULB TIP 10FT TU (MISCELLANEOUS) ×3 IMPLANT

## 2014-03-04 NOTE — Transfer of Care (Signed)
Immediate Anesthesia Transfer of Care Note  Patient: [name]  Procedure(s) Performed: Procedure(s): TOTAL LEFT KNEE ARTHROPLASTY (Left)  Patient Location: PACU  Anesthesia Type:General  Level of Consciousness: awake, alert  and oriented  Airway & Oxygen Therapy: Patient Spontanous Breathing and Patient connected to face mask oxygen  Post-op Assessment: Report given to RN and Post -op Vital signs reviewed and stable  Post vital signs: Reviewed and stable  Last Vitals:  Filed Vitals:   03/04/14 0911  BP: 126/85  Pulse: 94  Temp: 36.7 C  Resp: 20    Complications: No apparent anesthesia complications

## 2014-03-04 NOTE — Brief Op Note (Signed)
03/04/2014  12:48 PM  PATIENT:  Antonio Berg  59 y.o. male  PRE-OPERATIVE DIAGNOSIS:Primary   OA LEFT KNEE very Complex  POST-OPERATIVE DIAGNOSIS:Primary  OA LEFT KNEE very Complex.  PROCEDURE:  Procedure(s): TOTAL LEFT KNEE ARTHROPLASTY (Left) and Release of Contractures  SURGEON:  Surgeon(s) and Role:    * Jacki Conesonald A Jenaveve Fenstermaker, MD - Primary  PHYSICIAN ASSISTANT: Dimitri PedAmber Constable PA  ASSISTANTS: Dimitri PedAmber Constable PA  ANESTHESIA:   general  EBL:  Total I/O In: 2000 [I.V.:2000] Out: -   BLOOD ADMINISTERED:none  DRAINS: (One) Hemovact drain(s) in the Left Knee with  Suction Open   LOCAL MEDICATIONS USED:  MARCAINE 20cc of 0.25% plain and then 20cc of Exparel mixed with 20cc of Normal Saline.    SPECIMEN:  No Specimen  DISPOSITION OF SPECIMEN:  N/A  COUNTS:  YES  TOURNIQUET:  * Missing tourniquet times found for documented tourniquets in log:  201435 *  DICTATION: .Other Dictation: Dictation Number [id number]  PLAN OF CARE: Admit to inpatient   PATIENT DISPOSITION:  Stable in OR   Delay start of Pharmacological VTE agent (>24hrs) due to surgical blood loss or risk of bleeding: yes

## 2014-03-04 NOTE — Anesthesia Preprocedure Evaluation (Signed)
Anesthesia Evaluation  Patient identified by MRN, date of birth, ID band Patient awake    Reviewed: Allergy & Precautions, H&P , NPO status , Patient's Chart, lab work & pertinent test results  Airway Mallampati: II  TM Distance: >3 FB Neck ROM: Full    Dental no notable dental hx. (+) Partial Upper   Pulmonary neg pulmonary ROS, sleep apnea , former smoker,  breath sounds clear to auscultation  Pulmonary exam normal       Cardiovascular hypertension, Pt. on medications negative cardio ROS  Rhythm:Regular Rate:Normal     Neuro/Psych negative neurological ROS  negative psych ROS   GI/Hepatic negative GI ROS, Neg liver ROS,   Endo/Other  negative endocrine ROS  Renal/GU negative Renal ROS  negative genitourinary   Musculoskeletal negative musculoskeletal ROS (+)   Abdominal   Peds negative pediatric ROS (+)  Hematology negative hematology ROS (+)   Anesthesia Other Findings   Reproductive/Obstetrics negative OB ROS                             Anesthesia Physical  Anesthesia Plan  ASA: II  Anesthesia Plan: General   Post-op Pain Management:    Induction: Intravenous  Airway Management Planned: Oral ETT  Additional Equipment:   Intra-op Plan:   Post-operative Plan: Extubation in OR  Informed Consent: I have reviewed the patients History and Physical, chart, labs and discussed the procedure including the risks, benefits and alternatives for the proposed anesthesia with the patient or authorized representative who has indicated his/her understanding and acceptance.   Dental advisory given  Plan Discussed with: CRNA  Anesthesia Plan Comments:         Anesthesia Quick Evaluation

## 2014-03-04 NOTE — Interval H&P Note (Signed)
History and Physical Interval Note:  03/04/2014 10:30 AM  Antonio Berg  has presented today for surgery, with the diagnosis of OA LEFT KNEE  The various methods of treatment have been discussed with the patient and family. After consideration of risks, benefits and other options for treatment, the patient has consented to  Procedure(s): TOTAL LEFT KNEE ARTHROPLASTY (Left) as a surgical intervention .  The patient's history has been reviewed, patient examined, no change in status, stable for surgery.  I have reviewed the patient's chart and labs.  Questions were answered to the patient's satisfaction.     Heidi Maclin A

## 2014-03-04 NOTE — Anesthesia Procedure Notes (Signed)
Procedure Name: Intubation Date/Time: 03/04/2014 10:59 AM Performed by: Leroy LibmanEARDON, Antonio Berg Patient Re-evaluated:Patient Re-evaluated prior to inductionOxygen Delivery Method: Circle system utilized Preoxygenation: Pre-oxygenation with 100% oxygen Intubation Type: IV induction Ventilation: Mask ventilation without difficulty and Oral airway inserted - appropriate to patient size Grade View: Grade I Tube type: Oral Tube size: 8.0 mm Number of attempts: 1 Airway Equipment and Method: Stylet Placement Confirmation: ETT inserted through vocal cords under direct vision,  breath sounds checked- equal and bilateral and positive ETCO2 Secured at: 20 cm Tube secured with: Tape Dental Injury: Teeth and Oropharynx as per pre-operative assessment

## 2014-03-05 LAB — BASIC METABOLIC PANEL
ANION GAP: 9 (ref 5–15)
BUN: 14 mg/dL (ref 6–23)
CALCIUM: 8.7 mg/dL (ref 8.4–10.5)
CHLORIDE: 103 mmol/L (ref 96–112)
CO2: 27 mmol/L (ref 19–32)
Creatinine, Ser: 0.73 mg/dL (ref 0.50–1.35)
GFR calc Af Amer: >90 mL/min (ref >90)
GFR calc non Af Amer: >90 mL/min (ref >90)
Glucose, Bld: 127 mg/dL — ABNORMAL HIGH (ref 70–99)
POTASSIUM: 3.3 mmol/L — AB (ref 3.5–5.1)
Sodium: 139 mmol/L (ref 135–145)

## 2014-03-05 LAB — CBC
HCT: 34.3 % — ABNORMAL LOW (ref 39.0–52.0)
HEMOGLOBIN: 11.3 g/dL — AB (ref 13.0–17.0)
MCH: 28.4 pg (ref 26.0–34.0)
MCHC: 32.9 g/dL (ref 30.0–36.0)
MCV: 86.2 fL (ref 78.0–100.0)
Platelets: 234 10*3/uL (ref 150–400)
RBC: 3.98 MIL/uL — ABNORMAL LOW (ref 4.22–5.81)
RDW: 13.9 % (ref 11.5–15.5)
WBC: 12.2 10*3/uL — ABNORMAL HIGH (ref 4.0–10.5)

## 2014-03-05 MED ORDER — DOCUSATE SODIUM 100 MG PO CAPS
100.0000 mg | ORAL_CAPSULE | Freq: Two times a day (BID) | ORAL | Status: DC
  Administered 2014-03-05 – 2014-03-06 (×3): 100 mg via ORAL

## 2014-03-05 MED ORDER — OXYCODONE HCL 10 MG PO TABS: 10.0000 mg | ORAL_TABLET | ORAL | Status: AC | PRN

## 2014-03-05 MED ORDER — DOCUSATE SODIUM 100 MG PO CAPS: 100.0000 mg | ORAL_CAPSULE | Freq: Two times a day (BID) | ORAL | Status: AC

## 2014-03-05 MED ORDER — RIVAROXABAN 10 MG PO TABS: 10.0000 mg | ORAL_TABLET | Freq: Every day | ORAL | Status: AC

## 2014-03-05 MED ORDER — METHOCARBAMOL 500 MG PO TABS: 500.0000 mg | ORAL_TABLET | Freq: Four times a day (QID) | ORAL | Status: AC | PRN

## 2014-03-05 NOTE — Progress Notes (Signed)
OT Cancellation Note  Patient Details Name: Antonio Berg MRN: [id number] DOB: 12/18/1955   Cancelled Treatment:    Reason Eval/Treat Not Completed: Pain limiting ability to participate.  Pt just got back to bed with PT and had 9/10 pain.  Will check back tomorrow.   [name] 03/05/2014, 3:58 PM  [name], OTR/L [phone] 03/05/2014

## 2014-03-05 NOTE — Care Management Note (Signed)
    Page 1 of 2   03/05/2014     2:48:27 PM CARE MANAGEMENT NOTE 03/05/2014  Patient:  Antonio Berg, Antonio Berg   Account Number:  [id number]  Date Initiated:  03/05/2014  Documentation initiated by:  [facility]  Subjective/Objective Assessment:   adm: TOTAL LEFT KNEE ARTHROPLASTY (Left)     Action/Plan:   discharge planning   Anticipated DC Date:  03/05/2014   Anticipated DC Plan:  [place]  CM consult      [facility] Choice  HOME HEALTH   Choice offered to / List presented to:  C-1 Patient   DME arranged  3-N-1      DME agency  [place] arranged  [place]   Status of service:  Completed, signed off Medicare Important Message given?   (If response is "NO", the following Medicare IM given date fields will be blank) Date Medicare IM given:   Medicare IM given by:   Date Additional Medicare IM given:   Additional Medicare IM given by:    Discharge Disposition:  [place]  Per UR Regulation:    If discussed at Long Length of Stay Meetings, dates discussed:    Comments:  03/05/14 14:40 CM met with pt in room to offer choice of home health agency.  Pt chooses [facility] to render HHPT. Address and contact information verifid by pt.  Pt has rolling walker at home but requests a 3n1.  CM called [facility] DME rep, for a bari-3n1 and Rep, Pura Spice states it will be shipped to the pt's address.  Referral emailed to [employer], Tim.  No other CM needs were communicated.  Mariane Masters, BSN, [place].

## 2014-03-05 NOTE — Anesthesia Postprocedure Evaluation (Signed)
  Anesthesia Post-op Note  Patient: Antonio Berg  Procedure(s) Performed: Procedure(s) (LRB): TOTAL LEFT KNEE ARTHROPLASTY (Left)  Patient Location: PACU  Anesthesia Type: General  Level of Consciousness: awake and alert   Airway and Oxygen Therapy: Patient Spontanous Breathing  Post-op Pain: mild  Post-op Assessment: Post-op Vital signs reviewed, Patient's Cardiovascular Status Stable, Respiratory Function Stable, Patent Airway and No signs of Nausea or vomiting  Last Vitals:  Filed Vitals:   03/05/14 0630  BP: 110/78  Pulse: 77  Temp: 36.5 C  Resp: 16    Post-op Vital Signs: stable   Complications: No apparent anesthesia complications

## 2014-03-05 NOTE — Discharge Instructions (Addendum)
Walk with your walker. Weight bearing as tolerated Home Health Agency will follow you at home for your therapy  Do not change your dressing unless it appears compromised. Shower only, no tub bath. Call if any temperatures greater than 101 or any wound complications: [phone] during the day and ask for Dr. Jeannetta EllisGioffre's nurse, Mackey Birchwoodammy Johnson.Information on my medicine - XARELTO (Rivaroxaban)  This medication education was reviewed with me or my healthcare representative as part of my discharge preparation.  The pharmacist that spoke with me during my hospital stay was:  Maurice MarchJackson, Koralynn Greenspan E, [facility]  Why was Xarelto prescribed for you? Xarelto was prescribed for you to reduce the risk of blood clots forming after orthopedic surgery. The medical term for these abnormal blood clots is venous thromboembolism (VTE).  What do you need to know about xarelto ? Take your Xarelto ONCE DAILY at the same time every day. You may take it either with or without food.  If you have difficulty swallowing the tablet whole, you may crush it and mix in applesauce just prior to taking your dose.  Take Xarelto exactly as prescribed by your doctor and DO NOT stop taking Xarelto without talking to the doctor who prescribed the medication.  Stopping without other VTE prevention medication to take the place of Xarelto may increase your risk of developing a clot.  After discharge, you should have regular check-up appointments with your healthcare provider that is prescribing your Xarelto.    What do you do if you miss a dose? If you miss a dose, take it as soon as you remember on the same day then continue your regularly scheduled once daily regimen the next day. Do not take two doses of Xarelto on the same day.   Important Safety Information A possible side effect of Xarelto is bleeding. You should call your healthcare provider right away if you experience any of the following: ? Bleeding from an injury or your nose  that does not stop. ? Unusual colored urine (red or dark brown) or unusual colored stools (red or black). ? Unusual bruising for unknown reasons. ? A serious fall or if you hit your head (even if there is no bleeding).  Some medicines may interact with Xarelto and might increase your risk of bleeding while on Xarelto. To help avoid this, consult your healthcare provider or pharmacist prior to using any new prescription or non-prescription medications, including herbals, vitamins, non-steroidal anti-inflammatory drugs (NSAIDs) and supplements.  This website has more information on Xarelto: [email or web address].xarelto.[email or web address].

## 2014-03-05 NOTE — Op Note (Signed)
NAMENAKOA, GANUS NO.:  [id number]  MEDICAL RECORD NO.:  [id number]  LOCATION:  1617                         FACILITY:  [facility]  PHYSICIAN:  Georges Lynch. Joley Utecht, M.D.DATE OF BIRTH:  01/30/1955  DATE OF PROCEDURE:  03/04/2014 DATE OF DISCHARGE:                              OPERATIVE REPORT   SURGEON:  Georges Lynch. Darrelyn Hillock, MD  ASSISTANT:  Dimitri Ped, PA.  PREOPERATIVE DIAGNOSES: 1. Severe contracture, left knee. 2. Primary osteoarthritis, left knee.  POSTOPERATIVE DIAGNOSES: 1. Severe contracture, left knee. 2. Primary osteoarthritis, left knee.  OPERATION: 1. Release of multiple contractures, left knee. 2. Left total knee arthroplasty utilizing DePuy system.  All 3     components were cemented.  Gentamicin was used in the cement.  The     sizes used was a size 38 patella with 3 pegs, the tibial tray was a     size 4, the insert was a size 4, 17.5 mm thickness.  The femoral     component was a size 4 left posterior cruciate sacrificing type     prosthesis.  DESCRIPTION OF PROCEDURE:  Under general anesthesia, routine orthopedic prep and draping of the left lower extremity was carried out.  The patient had 3 g of IV Ancef.  He also had tranexamic acid.  He also had IV Tylenol.  The appropriate time-out was first carried out.  I also marked the appropriate left leg in the holding area.  At this time, the leg was exsanguinated with Esmarch, tourniquet was elevated to 350 mmHg. The leg was placed in the [facility] knee holder.  Knee was flexed.  The anterior approach of the knee was carried out.  Bleeders were identified and cauterized.  I then created 2 flaps.  Following that, I carried out a median parapatellar incision, reflected the patella laterally.  I did medial and lateral meniscectomies at this time.  I also excised the anterior-posterior cruciate ligament.  I did a nice medial release as well.  Following this, it was noted this knee was severely  arthritic. The initial drill hole was made in the intercondylar notch.  The guide rod was inserted.  I then inserted the next jig, removed 30 mm thickness off the distal femur because of the contractures and tightness of the knee. Also, we wanted to get a prosthesis we could because of his large man and because of his age.  At this point, we then measured the femur to be a size 4.  Note, he had marked overgrowth of spurs, we had to remove.  We then did anterior-posterior chamfering cuts for a size 4 for the femur.  At that time, attention was directed to the tibia.  I removed 6 mm thickness off the tibial plateau in the usual fashion.  We then inserted our lamina spreaders and removed all large spurs posteriorly off the medial side of the femoral condyle.  At this time, we went through the tension blocks and finally selected a size 17.5-mm thickness insert.  We then went on and prepared the tibial plateau.  We cut our keel cut out of the tibial plateau in the usual fashion.  We then cut our  notch cut out of the distal femur.  Note for the tibia we used the intra-articular guide.  We then inserted our trial components went to from 15-17.5 insert.  We kept the knee in extension and did our resurfacing procedure on the patella in the usual fashion.  Note, the patella also was quite thick with spurs.  We did a nice resurfacing procedure.  Three drill holes were made in the articular surface of the patella for a size 38 patella.  All components were removed.  I then thoroughly water picked out the knee, dried the knee out, cemented all 3 components in simultaneously.  Once the cement was hardened, we looked for loose pieces of cement, removed those, and then water picked out the knee as well.  The permanent insert was finally inserted.  There was a rotating platform size 4, 17.5 mm thickness.  We reduced the knee and had excellent function.  We had good extension, good medial and  lateral stability, and good flexion as well.  We then inserted a Hemovac drain after we irrigated out the knee and closed the knee in layers in usual fashion.          ______________________________ Georges Lynchonald A. Darrelyn HillockGioffre, M.D.     RAG/MEDQ  D:  03/04/2014  T:  03/05/2014  Job:  [id number]

## 2014-03-05 NOTE — Evaluation (Signed)
Physical Therapy Evaluation Patient Details Name: Antonio Berg MRN: [id number] DOB: [birth year] Today's Date: 03/05/2014   History of Present Illness  L TKR  Clinical Impression  Pt s/p L TKR presents with decreased L LE strength/ROM and post op pain limiting functional mobility.  Pt should progress to d/c home with family assist and HHPT follow up    Follow Up Recommendations Home health PT    Equipment Recommendations  None recommended by PT    Recommendations for Other Services OT consult     Precautions / Restrictions Precautions Precautions: Knee;Fall Required Braces or Orthoses: Knee Immobilizer - Left Knee Immobilizer - Left: Discontinue once straight leg raise with < 10 degree lag Restrictions Weight Bearing Restrictions: No Other Position/Activity Restrictions: WBAT      Mobility  Bed Mobility Overal bed mobility: Needs Assistance Bed Mobility: Supine to Sit     Supine to sit: Min assist     General bed mobility comments: cues for sequence and use of R LE to self assist  Transfers Overall transfer level: Needs assistance Equipment used: Rolling walker (2 wheeled) Transfers: Sit to/from Stand Sit to Stand: Min assist;From elevated surface         General transfer comment: cues for LE management and use of UEs to self assist  Ambulation/Gait Ambulation/Gait assistance: Min assist Ambulation Distance (Feet): 35 Feet Assistive device: Rolling walker (2 wheeled) Gait Pattern/deviations: Step-to pattern;Decreased step length - right;Decreased step length - left;Shuffle;Trunk flexed Gait velocity: decr   General Gait Details: cues for posture, sequence and position from [name] Mobility    Modified Rankin (Stroke Patients Only)       Balance                                             Pertinent Vitals/Pain Pain Assessment: 0-10 Pain Score: 7  Pain Location: L knee Pain Descriptors /  Indicators: Aching;Sore Pain Intervention(s): Limited activity within patient's tolerance;Monitored during session;Premedicated before session;Ice applied    Home Living Family/patient expects to be discharged to:: Private residence Living Arrangements: Spouse/significant other;Children Available Help at Discharge: Family;Friend(s) Type of Home: House Home Access: Level entry     Home Layout: One level Home Equipment: [name] - quad;Walker - 2 wheels      Prior Function Level of Independence: Independent;Independent with assistive device(s)               Hand Dominance        Extremity/Trunk Assessment   Upper Extremity Assessment: Overall WFL for tasks assessed           Lower Extremity Assessment: RLE deficits/detail;LLE deficits/detail RLE Deficits / Details: Strength WFL knee flex ltd to ~ 90 AROM LLE Deficits / Details: 2/5 quads with AAROM at knee -10 - 65  Cervical / Trunk Assessment: Normal  Communication   Communication: No difficulties  Cognition Arousal/Alertness: Awake/alert Behavior During Therapy: WFL for tasks assessed/performed Overall Cognitive Status: Within Functional Limits for tasks assessed                      General Comments      Exercises Total Joint Exercises Ankle Circles/Pumps: AROM;Both;15 reps;Supine Quad Sets: AROM;Both;10 reps;Supine Heel Slides: AAROM;Left;15 reps;Supine Straight Leg Raises: AAROM;Left;10 reps;Supine      Assessment/Plan  PT Assessment Patient needs continued PT services  PT Diagnosis Difficulty walking   PT Problem List Decreased strength;Decreased range of motion;Decreased activity tolerance;Decreased mobility;Decreased knowledge of use of DME;Obesity;Pain  PT Treatment Interventions DME instruction;Gait training;Stair training;Functional mobility training;Therapeutic activities;Therapeutic exercise;Patient/family education   PT Goals (Current goals can be found in the Care Plan section)  Acute Rehab PT Goals Patient Stated Goal: Resume previous lifestyle with decreased pain PT Goal Formulation: With patient Time For Goal Achievement: 03/12/14 Potential to Achieve Goals: Good    Frequency 7X/week   Barriers to discharge        Co-evaluation               End of Session Equipment Utilized During Treatment: Gait belt;Left knee immobilizer Activity Tolerance: Patient tolerated treatment well Patient left: in chair;with call bell/phone within reach;with family/visitor present Nurse Communication: Mobility status         Time: [id number] PT Time Calculation (min) (ACUTE ONLY): 32 min   Charges:   PT Evaluation $Initial PT Evaluation Tier I: 1 Procedure PT Treatments $Therapeutic Exercise: 8-22 mins   PT G Codes:        [name] 03/05/2014, 12:44 PM

## 2014-03-05 NOTE — Progress Notes (Signed)
Subjective: 1 Day Post-Op Procedure(s) (LRB): TOTAL LEFT KNEE ARTHROPLASTY (Left) Patient reports pain as 3 on 0-10 scale.Doing well. Hemovac DCd.Will watch closely for quad weakness secondary to Nerve Block. He could do a Straight leg raising today.    Objective: Vital signs in last 24 hours: Temp:  [97.7 F (36.5 C)-98.6 F (37 C)] 97.7 F (36.5 C) (02/18 0630) Pulse Rate:  [71-94] 77 (02/18 0630) Resp:  [12-20] 16 (02/18 0630) BP: (106-165)/(73-101) 110/78 mmHg (02/18 0630) SpO2:  [93 %-100 %] 100 % (02/18 0630) Weight:  [132.45 kg (292 lb)] 132.45 kg (292 lb) (02/17 0933)  Intake/Output from previous day: 02/17 0701 - 02/18 0700 In: 5304.2 [P.O.:960; I.V.:4079.2; IV Piggyback:265] Out: 2799 [Urine:2550; Drains:149; Blood:100] Intake/Output this shift:     Recent Labs  03/04/14 0958 03/05/14 0520  HGB 13.8 11.3*    Recent Labs  03/04/14 0958 03/05/14 0520  WBC 7.3 12.2*  RBC 4.89 3.98*  HCT 42.3 34.3*  PLT 267 234    Recent Labs  03/05/14 0520  NA 139  K 3.3*  CL 103  CO2 27  BUN 14  CREATININE 0.73  GLUCOSE 127*  CALCIUM 8.7   No results for input(s): LABPT, INR in the last 72 hours.  Dorsiflexion/Plantar flexion intact  Assessment/Plan: 1 Day Post-Op Procedure(s) (LRB): TOTAL LEFT KNEE ARTHROPLASTY (Left) Up with therapy discontinued tomorrow.  Zedrick Springsteen A 03/05/2014, 7:15 AM

## 2014-03-05 NOTE — Progress Notes (Signed)
Physical Therapy Treatment Patient Details Name: Antonio Berg MRN: [id number] DOB: 01/14/1956 Today's Date: 03/05/2014    History of Present Illness L TKR    PT Comments      Follow Up Recommendations  Home health PT     Equipment Recommendations  None recommended by PT    Recommendations for Other Services OT consult     Precautions / Restrictions Precautions Precautions: Knee;Fall Required Braces or Orthoses: Knee Immobilizer - Left Knee Immobilizer - Left: Discontinue once straight leg raise with < 10 degree lag Restrictions Weight Bearing Restrictions: No Other Position/Activity Restrictions: WBAT    Mobility  Bed Mobility Overal bed mobility: Needs Assistance Bed Mobility: Sit to Supine       Sit to supine: Min assist   General bed mobility comments: cues for sequence and use of R LE to self assist  Transfers Overall transfer level: Needs assistance Equipment used: Rolling walker (2 wheeled) Transfers: Sit to/from Stand Sit to Stand: Min assist;Mod assist         General transfer comment: cues for LE management and use of UEs to self assist  Ambulation/Gait Ambulation/Gait assistance: Min assist Ambulation Distance (Feet): 68 Feet Assistive device: Rolling walker (2 wheeled) Gait Pattern/deviations: Step-to pattern;Decreased step length - right;Decreased step length - left;Shuffle;Trunk flexed Gait velocity: decr   General Gait Details: cues for posture, sequence and position from [employer]   Stairs            Wheelchair Mobility    Modified Rankin (Stroke Patients Only)       Balance                                    Cognition Arousal/Alertness: Awake/alert Behavior During Therapy: WFL for tasks assessed/performed Overall Cognitive Status: Within Functional Limits for tasks assessed                      Exercises      General Comments        Pertinent Vitals/Pain Pain Assessment: 0-10 Pain Score: 8   Pain Location: L knee Pain Descriptors / Indicators: Aching;Burning;Sore Pain Intervention(s): Limited activity within patient's tolerance;Monitored during session;Premedicated before session;Ice applied;Patient requesting pain meds-RN notified    Home Living                      Prior Function            PT Goals (current goals can now be found in the care plan section) Acute Rehab PT Goals Patient Stated Goal: Resume previous lifestyle with decreased pain PT Goal Formulation: With patient Time For Goal Achievement: 03/12/14 Potential to Achieve Goals: Good Progress towards PT goals: Progressing toward goals    Frequency  7X/week    PT Plan Current plan remains appropriate    Co-evaluation             End of Session Equipment Utilized During Treatment: Gait belt;Left knee immobilizer Activity Tolerance: Patient tolerated treatment well Patient left: in bed;with call bell/phone within reach;with family/visitor present     Time: [id number] PT Time Calculation (min) (ACUTE ONLY): 20 min  Charges:  $Gait Training: 23-37 mins                    G Codes:      Antonio Berg 03/05/2014, 4:31 PM

## 2014-03-06 LAB — CBC
HEMATOCRIT: 33.5 % — AB (ref 39.0–52.0)
HEMOGLOBIN: 10.9 g/dL — AB (ref 13.0–17.0)
MCH: 28.4 pg (ref 26.0–34.0)
MCHC: 32.5 g/dL (ref 30.0–36.0)
MCV: 87.2 fL (ref 78.0–100.0)
Platelets: 212 10*3/uL (ref 150–400)
RBC: 3.84 MIL/uL — ABNORMAL LOW (ref 4.22–5.81)
RDW: 14.4 % (ref 11.5–15.5)
WBC: 11.8 10*3/uL — ABNORMAL HIGH (ref 4.0–10.5)

## 2014-03-06 LAB — BASIC METABOLIC PANEL
ANION GAP: 6 (ref 5–15)
BUN: 13 mg/dL (ref 6–23)
CHLORIDE: 105 mmol/L (ref 96–112)
CO2: 28 mmol/L (ref 19–32)
Calcium: 8.7 mg/dL (ref 8.4–10.5)
Creatinine, Ser: 0.72 mg/dL (ref 0.50–1.35)
GFR calc Af Amer: >90 mL/min (ref >90)
GFR calc non Af Amer: >90 mL/min (ref >90)
Glucose, Bld: 107 mg/dL — ABNORMAL HIGH (ref 70–99)
POTASSIUM: 3.5 mmol/L (ref 3.5–5.1)
SODIUM: 139 mmol/L (ref 135–145)

## 2014-03-06 MED ORDER — TRIAMTERENE-HCTZ 37.5-25 MG PO TABS
1.0000 | ORAL_TABLET | Freq: Two times a day (BID) | ORAL | Status: DC
  Administered 2014-03-06: 1 via ORAL
  Filled 2014-03-06 (×2): qty 1

## 2014-03-06 NOTE — Plan of Care (Signed)
Problem: Phase III Progression Outcomes Goal: Anticoagulant follow-up in place Outcome: Not Applicable Date Met:  30/17/20 Xarelto VTE, no f/u needed.

## 2014-03-06 NOTE — Evaluation (Signed)
Occupational Therapy Evaluation Patient Details Name: Antonio Berg MRN: [id number] DOB: 03/12/1955 Today's Date: 03/06/2014    History of Present Illness L TKR   Clinical Impression   This 59 year old male was admitted for the above surgery.  He has a h/o hip sx in 2014.  Reviewed all education and pt verbalizes understanding.  Wife has had back sx but feels she can help him as needed.  Pt will be able to use AE and sheet to manage LLE for bed mobility and tub bench to maximize his independence.    Follow Up Recommendations  No OT follow up;Supervision/Assistance - 24 hour    Equipment Recommendations  None recommended by OT    Recommendations for Other Services       Precautions / Restrictions Precautions Precautions: Knee;Fall Required Braces or Orthoses: Knee Immobilizer - Left Knee Immobilizer - Left: Discontinue once straight leg raise with < 10 degree lag Restrictions Other Position/Activity Restrictions: WBAT      Mobility Bed Mobility           Sit to supine: Min assist   General bed mobility comments: cues for technique; used sheet and min A from therapist to guard  Transfers   Equipment used: Rolling walker (2 wheeled) Transfers: Sit to/from Stand Sit to Stand: Min assist         General transfer comment: cues for LE management and use of UEs to self assist    Balance                                            ADL Overall ADL's : Needs assistance/impaired             Lower Body Bathing: Minimal assistance;With adaptive equipment;Sit to/from stand       Lower Body Dressing: Moderate assistance;With adaptive equipment;Sit to/from stand                 General ADL Comments: pt had just returned from bathroom:  feels comfortable with commode transfers.  He has AE from when he had hip sx.  Educated on crossing strong leg under operated leg or using sheet to get in and out of bed.  Wife has had back sx.  She feels  she can help him with this.  I gave him min A to minimize pain.  Also educated on crossing RLE under L to raise leg when using AE as pt cannot do this himself yet.  Educated on precautions and pillow under calf but not under knee  Pt has a tub transfer bench:  Reviewed sequence, donning/doffing KI when he first sits on bench or inside of tub and using sheet to assist LLE     Vision     Perception     Praxis      Pertinent Vitals/Pain Pain Score: 9  (with movement):  Decreased to 8 once repositioned Pain Descriptors / Indicators: Aching Pain Intervention(s): Limited activity within patient's tolerance;Monitored during session;Premedicated before session;Repositioned;Ice applied     Hand Dominance     Extremity/Trunk Assessment Upper Extremity Assessment Upper Extremity Assessment: Overall WFL for tasks assessed           Communication Communication Communication: No difficulties   Cognition Arousal/Alertness: Awake/alert Behavior During Therapy: WFL for tasks assessed/performed Overall Cognitive Status: Within Functional Limits for tasks assessed  General Comments       Exercises       Shoulder Instructions      Home Living Family/patient expects to be discharged to:: Private residence Living Arrangements: Spouse/significant other;Children Available Help at Discharge: Family;Friend(s)                   Bathroom Toilet: Standard     Home Equipment: Bedside commode;Tub bench          Prior Functioning/Environment Level of Independence: Independent;Independent with assistive device(s)             OT Diagnosis: Generalized weakness   OT Problem List:     OT Treatment/Interventions:      OT Goals(Current goals can be found in the care plan section) Acute Rehab OT Goals Patient Stated Goal: Resume previous lifestyle with decreased pain  OT Frequency:     Barriers to D/C:            Co-evaluation               End of Session    Activity Tolerance: Patient tolerated treatment well Patient left: in bed;with call bell/phone within reach;with family/visitor present   Time: 1610-9604 OT Time Calculation (min): 19 min Charges:  OT General Charges $OT Visit: 1 Procedure OT Evaluation $Initial OT Evaluation Tier I: 1 Procedure G-Codes:    Imunique Samad 2014-03-19, 8:26 AM Marica Otter, OTR/L [phone] 03-19-2014

## 2014-03-06 NOTE — Progress Notes (Signed)
Physical Therapy Treatment Patient Details Name: Antonio Berg MRN: [id number] DOB: 09/24/1955 Today's Date: 03/06/2014    History of Present Illness L TKR    PT Comments    Pt progressing well with mobility.  Reviewed don/doff KI with pt and wife as well as assist with dressing.  Follow Up Recommendations  Home health PT     Equipment Recommendations  None recommended by PT    Recommendations for Other Services OT consult     Precautions / Restrictions Precautions Precautions: Knee;Fall Required Braces or Orthoses: Knee Immobilizer - Left Knee Immobilizer - Left: Discontinue once straight leg raise with < 10 degree lag Restrictions Weight Bearing Restrictions: No Other Position/Activity Restrictions: WBAT    Mobility  Bed Mobility Overal bed mobility: Needs Assistance Bed Mobility: Supine to Sit     Supine to sit: Min assist     General bed mobility comments: cues for sequence, min assist wtih L LE  Transfers Overall transfer level: Needs assistance Equipment used: Rolling walker (2 wheeled) Transfers: Sit to/from Stand Sit to Stand: Min guard;Supervision         General transfer comment: cues for LE management and use of UEs to self assist  Ambulation/Gait Ambulation/Gait assistance: Min guard;Supervision Ambulation Distance (Feet): 85 Feet Assistive device: Rolling walker (2 wheeled) Gait Pattern/deviations: Step-to pattern;Decreased step length - right;Decreased step length - left;Shuffle;Trunk flexed Gait velocity: decr   General Gait Details: cues for posture, sequence and position from [employer] Mobility    Modified Rankin (Stroke Patients Only)       Balance                                    Cognition Arousal/Alertness: Awake/alert Behavior During Therapy: WFL for tasks assessed/performed Overall Cognitive Status: Within Functional Limits for tasks assessed                       Exercises Total Joint Exercises Ankle Circles/Pumps: AROM;Both;15 reps;Supine Quad Sets: AROM;Both;Supine;15 reps Heel Slides: AAROM;Left;15 reps;Supine Straight Leg Raises: AAROM;Left;Supine;15 reps Goniometric ROM: AAROM at L knee -10 - 45    General Comments        Pertinent Vitals/Pain Pain Assessment: 0-10 Pain Score: 7  Pain Location: L knee Pain Descriptors / Indicators: Aching;Sore Pain Intervention(s): Limited activity within patient's tolerance;Monitored during session;Premedicated before session;Ice applied    Home Living                      Prior Function            PT Goals (current goals can now be found in the care plan section) Acute Rehab PT Goals Patient Stated Goal: Resume previous lifestyle with decreased pain PT Goal Formulation: With patient Time For Goal Achievement: 03/12/14 Potential to Achieve Goals: Good Progress towards PT goals: Progressing toward goals    Frequency  7X/week    PT Plan Current plan remains appropriate    Co-evaluation             End of Session Equipment Utilized During Treatment: Gait belt;Left knee immobilizer Activity Tolerance: Patient tolerated treatment well Patient left: in chair;with call bell/phone within reach;with family/visitor present     Time: 1000-1042 PT Time Calculation (min) (ACUTE ONLY): 42 min  Charges:  $Gait Training: 8-22 mins $Therapeutic Exercise: 8-22  mins $Therapeutic Activity: 8-22 mins                    G Codes:      Renna Kilmer 03/06/2014, 12:57 PM

## 2014-03-06 NOTE — Progress Notes (Signed)
Subjective: 2 Days Post-Op Procedure(s) (LRB): TOTAL LEFT KNEE ARTHROPLASTY (Left) Patient reports pain as 4 on 0-10 scale.Doing better but still has pain in his knee. Will DC today.    Objective: Vital signs in last 24 hours: Temp:  [97.5 F (36.4 C)-98 F (36.7 C)] 98 F (36.7 C) (02/18 2220) Pulse Rate:  [60-79] 79 (02/18 2220) Resp:  [16-20] 20 (02/18 2220) BP: (116-118)/(47-74) 118/74 mmHg (02/18 2220) SpO2:  [99 %-100 %] 100 % (02/18 2220)  Intake/Output from previous day: 02/18 0701 - 02/19 0700 In: 2448.3 [P.O.:1320; I.V.:1128.3] Out: 1450 [Urine:1450] Intake/Output this shift:     Recent Labs  03/04/14 0958 03/05/14 0520 03/06/14 0453  HGB 13.8 11.3* 10.9*    Recent Labs  03/05/14 0520 03/06/14 0453  WBC 12.2* 11.8*  RBC 3.98* 3.84*  HCT 34.3* 33.5*  PLT 234 212    Recent Labs  03/05/14 0520 03/06/14 0453  NA 139 139  K 3.3* 3.5  CL 103 105  CO2 27 28  BUN 14 13  CREATININE 0.73 0.72  GLUCOSE 127* 107*  CALCIUM 8.7 8.7   No results for input(s): LABPT, INR in the last 72 hours.  Dorsiflexion/Plantar flexion intact No cellulitis present  Assessment/Plan: 2 Days Post-Op Procedure(s) (LRB): TOTAL LEFT KNEE ARTHROPLASTY (Left) Discharge home with home health  Kyion Gautier A 03/06/2014, 7:05 AM

## 2014-03-06 NOTE — Progress Notes (Signed)
RN reviewed discharge education with patient and family. All questions answered.   Paperwork and prescriptions given.   NT rolled patient down in wheelchair to family car.  

## 2014-03-09 NOTE — Discharge Summary (Signed)
Physician Discharge Summary   Patient ID: Antonio Berg MRN: [id number] DOB/AGE: 61-Jul-1957 61 y.o.  Admit date: 03/04/2014 Discharge date: 03/06/2014  Primary Diagnosis: Primary osteoarthritis, left knee   Admission Diagnoses:  Past Medical History  Diagnosis Date  . Hypertension   . Sleep apnea     not used since 2011   . Arthritis   . History of gout    Discharge Diagnoses:   Active Problems:   Total knee replacement status  Estimated body mass index is 38.53 kg/(m^2) as calculated from the following:   Height as of this encounter: 6' 1"  (1.854 m).   Weight as of this encounter: 132.45 kg (292 lb).  Procedure:  Procedure(s) (LRB): TOTAL LEFT KNEE ARTHROPLASTY (Left)   Consults: None  HPI: Antonio Berg, 59 y.o. male, has a history of pain and functional disability in the left knee due to arthritis and has failed non-surgical conservative treatments for greater than 12 weeks to includeNSAID's and/or analgesics, corticosteriod injections and activity modification. Onset of symptoms was gradual, starting 5 years ago with gradually worsening course since that time. The patient noted prior procedures on the knee to include arthroscopy and menisectomy on the left knee(s). Patient currently rates pain in the left knee(s) at 7 out of 10 with activity. Patient has night pain, worsening of pain with activity and weight bearing, pain that interferes with activities of daily living, pain with passive range of motion, crepitus and joint swelling. Patient has evidence of periarticular osteophytes and joint space narrowing by imaging studies. There is no active infection.   Laboratory Data: Admission on 03/04/2014, Discharged on 03/06/2014  Component Date Value Ref Range Status  . ABO/RH(D) 03/04/2014 AB POS   Final  . Antibody Screen 03/04/2014 NEG   Final  . Sample Expiration 03/04/2014 03/07/2014   Final  . WBC 03/04/2014 7.3  4.0 - 10.5 K/uL Final  . RBC 03/04/2014 4.89  4.22 -  5.81 MIL/uL Final  . Hemoglobin 03/04/2014 13.8  13.0 - 17.0 g/dL Final  . HCT 03/04/2014 42.3  39.0 - 52.0 % Final  . MCV 03/04/2014 86.5  78.0 - 100.0 fL Final  . MCH 03/04/2014 28.2  26.0 - 34.0 pg Final  . MCHC 03/04/2014 32.6  30.0 - 36.0 g/dL Final  . RDW 03/04/2014 14.1  11.5 - 15.5 % Final  . Platelets 03/04/2014 267  150 - 400 K/uL Final  . WBC 03/05/2014 12.2* 4.0 - 10.5 K/uL Final  . RBC 03/05/2014 3.98* 4.22 - 5.81 MIL/uL Final  . Hemoglobin 03/05/2014 11.3* 13.0 - 17.0 g/dL Final   Comment: REPEATED TO VERIFY DELTA CHECK NOTED   . HCT 03/05/2014 34.3* 39.0 - 52.0 % Final  . MCV 03/05/2014 86.2  78.0 - 100.0 fL Final  . MCH 03/05/2014 28.4  26.0 - 34.0 pg Final  . MCHC 03/05/2014 32.9  30.0 - 36.0 g/dL Final  . RDW 03/05/2014 13.9  11.5 - 15.5 % Final  . Platelets 03/05/2014 234  150 - 400 K/uL Final  . Sodium 03/05/2014 139  135 - 145 mmol/L Final  . Potassium 03/05/2014 3.3* 3.5 - 5.1 mmol/L Final  . Chloride 03/05/2014 103  96 - 112 mmol/L Final  . CO2 03/05/2014 27  19 - 32 mmol/L Final  . Glucose, Bld 03/05/2014 127* 70 - 99 mg/dL Final  . BUN 03/05/2014 14  6 - 23 mg/dL Final  . Creatinine, Ser 03/05/2014 0.73  0.50 - 1.35 mg/dL Final  . Calcium 03/05/2014 8.7  8.4 - 10.5 mg/dL Final  . GFR calc non Af Amer 03/05/2014 >90  >90 mL/min Final  . GFR calc Af Amer 03/05/2014 >90  >90 mL/min Final   Comment: (NOTE) The eGFR has been calculated using the CKD EPI equation. This calculation has not been validated in all clinical situations. eGFR's persistently <90 mL/min signify possible Chronic Kidney Disease.   . Anion gap 03/05/2014 9  5 - 15 Final  . WBC 03/06/2014 11.8* 4.0 - 10.5 K/uL Final  . RBC 03/06/2014 3.84* 4.22 - 5.81 MIL/uL Final  . Hemoglobin 03/06/2014 10.9* 13.0 - 17.0 g/dL Final  . HCT 03/06/2014 33.5* 39.0 - 52.0 % Final  . MCV 03/06/2014 87.2  78.0 - 100.0 fL Final  . MCH 03/06/2014 28.4  26.0 - 34.0 pg Final  . MCHC 03/06/2014 32.5  30.0 -  36.0 g/dL Final  . RDW 03/06/2014 14.4  11.5 - 15.5 % Final  . Platelets 03/06/2014 212  150 - 400 K/uL Final  . Sodium 03/06/2014 139  135 - 145 mmol/L Final  . Potassium 03/06/2014 3.5  3.5 - 5.1 mmol/L Final  . Chloride 03/06/2014 105  96 - 112 mmol/L Final  . CO2 03/06/2014 28  19 - 32 mmol/L Final  . Glucose, Bld 03/06/2014 107* 70 - 99 mg/dL Final  . BUN 03/06/2014 13  6 - 23 mg/dL Final  . Creatinine, Ser 03/06/2014 0.72  0.50 - 1.35 mg/dL Final  . Calcium 03/06/2014 8.7  8.4 - 10.5 mg/dL Final  . GFR calc non Af Amer 03/06/2014 >90  >90 mL/min Final  . GFR calc Af Amer 03/06/2014 >90  >90 mL/min Final   Comment: (NOTE) The eGFR has been calculated using the CKD EPI equation. This calculation has not been validated in all clinical situations. eGFR's persistently <90 mL/min signify possible Chronic Kidney Disease.   Georgiann Hahn gap 03/06/2014 6  5 - 15 Final  Hospital Outpatient Visit on 02/23/2014  Component Date Value Ref Range Status  . MRSA, PCR 02/23/2014 NEGATIVE  NEGATIVE Final  . Staphylococcus aureus 02/23/2014 NEGATIVE  NEGATIVE Final   Comment:        The Xpert SA Assay (FDA approved for NASAL specimens in patients over 12 years of age), is one component of a comprehensive surveillance program.  Test performance has been validated by [facility] for patients greater than or equal to 6 year old. It is not intended to diagnose infection nor to guide or monitor treatment.   Marland Kitchen aPTT 02/23/2014 31  24 - 37 seconds Final  . Sodium 02/23/2014 139  135 - 145 mmol/L Final  . Potassium 02/23/2014 3.4* 3.5 - 5.1 mmol/L Final  . Chloride 02/23/2014 101  96 - 112 mmol/L Final  . CO2 02/23/2014 32  19 - 32 mmol/L Final  . Glucose, Bld 02/23/2014 76  70 - 99 mg/dL Final  . BUN 02/23/2014 11  6 - 23 mg/dL Final  . Creatinine, Ser 02/23/2014 0.80  0.50 - 1.35 mg/dL Final  . Calcium 02/23/2014 9.7  8.4 - 10.5 mg/dL Final  . Total Protein 02/23/2014 7.9  6.0 - 8.3 g/dL Final    . Albumin 02/23/2014 4.3  3.5 - 5.2 g/dL Final  . AST 02/23/2014 22  0 - 37 U/L Final  . ALT 02/23/2014 17  0 - 53 U/L Final  . Alkaline Phosphatase 02/23/2014 75  39 - 117 U/L Final  . Total Bilirubin 02/23/2014 0.7  0.3 - 1.2 mg/dL Final  .  GFR calc non Af Amer 02/23/2014 >90  >90 mL/min Final  . GFR calc Af Amer 02/23/2014 >90  >90 mL/min Final   Comment: (NOTE) The eGFR has been calculated using the CKD EPI equation. This calculation has not been validated in all clinical situations. eGFR's persistently <90 mL/min signify possible Chronic Kidney Disease.   . Anion gap 02/23/2014 6  5 - 15 Final  . Prothrombin Time 02/23/2014 13.9  11.6 - 15.2 seconds Final  . INR 02/23/2014 1.06  0.00 - 1.49 Final  . Color, Urine 02/23/2014 YELLOW  YELLOW Final  . APPearance 02/23/2014 CLEAR  CLEAR Final  . Specific Gravity, Urine 02/23/2014 1.009  1.005 - 1.030 Final  . pH 02/23/2014 8.0  5.0 - 8.0 Final  . Glucose, UA 02/23/2014 NEGATIVE  NEGATIVE mg/dL Final  . Hgb urine dipstick 02/23/2014 NEGATIVE  NEGATIVE Final  . Bilirubin Urine 02/23/2014 NEGATIVE  NEGATIVE Final  . Ketones, ur 02/23/2014 NEGATIVE  NEGATIVE mg/dL Final  . Protein, ur 02/23/2014 NEGATIVE  NEGATIVE mg/dL Final  . Urobilinogen, UA 02/23/2014 0.2  0.0 - 1.0 mg/dL Final  . Nitrite 02/23/2014 NEGATIVE  NEGATIVE Final  . Leukocytes, UA 02/23/2014 NEGATIVE  NEGATIVE Final   MICROSCOPIC NOT DONE ON URINES WITH NEGATIVE PROTEIN, BLOOD, LEUKOCYTES, NITRITE, OR GLUCOSE <1000 mg/dL.     X-Rays:Dg Chest 2 View  02/23/2014   CLINICAL DATA:  Preop knee surgery  EXAM: CHEST  2 VIEW  COMPARISON:  04/14/2012  FINDINGS: The heart size and mediastinal contours are within normal limits. Both lungs are clear. The visualized skeletal structures are unremarkable.  IMPRESSION: No active cardiopulmonary disease.   Electronically Signed   By: Franchot Gallo M.D.   On: 02/23/2014 14:00    EKG: Orders placed or performed during the hospital  encounter of 04/03/12  . EKG  . EKG     Hospital Course: Wenceslao Loper is a 58 y.o. who was admitted to [facility]. They were brought to the operating room on 03/04/2014 and underwent Procedure(s): TOTAL LEFT KNEE ARTHROPLASTY.  Patient tolerated the procedure well and was later transferred to the recovery room and then to the orthopaedic floor for postoperative care.  They were given PO and IV analgesics for pain control following their surgery.  They were given 24 hours of postoperative antibiotics of  Anti-infectives    Start     Dose/Rate Route Frequency Ordered Stop   03/04/14 1800  ceFAZolin (ANCEF) IVPB 1 g/50 mL premix     1 g 100 mL/hr over 30 Minutes Intravenous Every 6 hours 03/04/14 1522 03/05/14 0055   03/04/14 1136  polymyxin B 500,000 Units, bacitracin 50,000 Units in sodium chloride irrigation 0.9 % 500 mL irrigation  Status:  Discontinued       As needed 03/04/14 1136 03/04/14 1323   03/04/14 0600  ceFAZolin (ANCEF) 3 g in dextrose 5 % 50 mL IVPB     3 g 160 mL/hr over 30 Minutes Intravenous On call to O.R. 03/03/14 1325 03/04/14 1100     and started on DVT prophylaxis in the form of Xarelto.   PT and OT were ordered for total joint protocol.  Discharge planning consulted to help with postop disposition and equipment needs.  Patient had a fair night on the evening of surgery.  They started to get up OOB with therapy on day one. Hemovac drain was pulled without difficulty.  Continued to work with therapy into day two. Patient was seen in rounds and was  ready to go home.   Diet: Cardiac diet Activity:WBAT Follow-up:in 2 weeks Disposition - Home Discharged Condition: stable   Discharge Instructions    Call MD / Call 911    Complete by:  As directed   If you experience chest pain or shortness of breath, CALL 911 and be transported to the hospital emergency room.  If you develope a fever above 101 F, pus (white drainage) or increased drainage or redness at the  wound, or calf pain, call your surgeon's office.     Constipation Prevention    Complete by:  As directed   Drink plenty of fluids.  Prune juice may be helpful.  You may use a stool softener, such as Colace (over the counter) 100 mg twice a day.  Use MiraLax (over the counter) for constipation as needed.     Diet - low sodium heart healthy    Complete by:  As directed      Discharge instructions    Complete by:  As directed   Walk with your walker. Weight bearing as tolerated [facility] will follow you at home for your therapy  Do not change your dressing unless it appears compromised. Shower only, no tub bath. Call if any temperatures greater than 101 or any wound complications: [phone] during the day and ask for Dr. Charlestine Night nurse, Brunilda Payor.     Do not put a pillow under the knee. Place it under the heel.    Complete by:  As directed      Driving restrictions    Complete by:  As directed   No driving     Increase activity slowly as tolerated    Complete by:  As directed             Medication List    STOP taking these medications        amoxicillin-clavulanate 875-125 MG per tablet  Commonly known as:  AUGMENTIN      TAKE these medications        allopurinol 100 MG tablet  Commonly known as:  ZYLOPRIM  Take 100 mg by mouth 2 (two) times daily.     amLODipine 10 MG tablet  Commonly known as:  NORVASC  Take 10 mg by mouth daily before breakfast.     BENGAY EX  Apply 1 application topically daily as needed (For pain.).     docusate sodium 100 MG capsule  Commonly known as:  COLACE  Take 1 capsule (100 mg total) by mouth 2 (two) times daily.     ferrous sulfate 325 (65 FE) MG tablet  Take 1 tablet (325 mg total) by mouth 3 (three) times daily after meals.     methocarbamol 500 MG tablet  Commonly known as:  ROBAXIN  Take 1 tablet (500 mg total) by mouth every 6 (six) hours as needed.     Oxycodone HCl 10 MG Tabs  Take 1-2 tablets (10-20 mg  total) by mouth every 3 (three) hours as needed for severe pain.     oxymorphone 30 MG 12 hr tablet  Commonly known as:  OPANA ER  Take 30 mg by mouth every 12 (twelve) hours.     potassium chloride SA 20 MEQ tablet  Commonly known as:  K-DUR,KLOR-CON  Take 20 mEq by mouth 2 (two) times daily.     rivaroxaban 10 MG Tabs tablet  Commonly known as:  XARELTO  Take 1 tablet (10 mg total) by mouth daily.  sildenafil 100 MG tablet  Commonly known as:  VIAGRA  Take 100 mg by mouth daily as needed for erectile dysfunction.     triamterene-hydrochlorothiazide 37.5-25 MG per capsule  Commonly known as:  DYAZIDE  Take 1 capsule by mouth 2 (two) times daily.           Follow-up Information    Follow up with GIOFFRE,RONALD A, MD. Schedule an appointment as soon as possible for a visit in 2 weeks.   Specialty:  Orthopedic Surgery   Contact information:   [address] [place] [address] [phone]       Follow up with [facility].   Why:  home health physical therapy   Contact information:   [place] [address] [phone]       Follow up with [facility].   Why:  3n1 (commode)   Contact information:   [address] [place] [address] [phone]       Signed: Ardeen Jourdain, PA-C Orthopaedic Surgery 03/09/2014, 10:36 AM

## 2014-10-10 ENCOUNTER — Emergency Department (HOSPITAL_COMMUNITY)
Admission: EM | Admit: 2014-10-10 | Discharge: 2014-10-11 | Disposition: A | Discharge status: Home / Self Care | Payer: BLUE CROSS/BLUE SHIELD | Attending: Emergency Medicine | Admitting: Emergency Medicine

## 2014-10-10 ENCOUNTER — Encounter (HOSPITAL_COMMUNITY): Payer: Self-pay | Admitting: Emergency Medicine

## 2014-10-10 DIAGNOSIS — M25559 Pain in unspecified hip: Secondary | ICD-10-CM | POA: Insufficient documentation

## 2014-10-10 DIAGNOSIS — M79604 Pain in right leg: Secondary | ICD-10-CM | POA: Diagnosis not present

## 2014-10-10 DIAGNOSIS — M109 Gout, unspecified: Secondary | ICD-10-CM | POA: Diagnosis not present

## 2014-10-10 DIAGNOSIS — M79605 Pain in left leg: Secondary | ICD-10-CM | POA: Insufficient documentation

## 2014-10-10 DIAGNOSIS — Z8669 Personal history of other diseases of the nervous system and sense organs: Secondary | ICD-10-CM | POA: Diagnosis not present

## 2014-10-10 DIAGNOSIS — G8929 Other chronic pain: Secondary | ICD-10-CM | POA: Diagnosis not present

## 2014-10-10 DIAGNOSIS — M1990 Unspecified osteoarthritis, unspecified site: Secondary | ICD-10-CM | POA: Diagnosis not present

## 2014-10-10 DIAGNOSIS — I10 Essential (primary) hypertension: Secondary | ICD-10-CM | POA: Insufficient documentation

## 2014-10-10 DIAGNOSIS — Z79899 Other long term (current) drug therapy: Secondary | ICD-10-CM | POA: Insufficient documentation

## 2014-10-10 LAB — BASIC METABOLIC PANEL
Anion gap: 6 (ref 5–15)
BUN: 16 mg/dL (ref 6–20)
CO2: 28 mmol/L (ref 22–32)
CREATININE: 0.72 mg/dL (ref 0.61–1.24)
Calcium: 9 mg/dL (ref 8.9–10.3)
Chloride: 105 mmol/L (ref 101–111)
GFR calc Af Amer: >60 mL/min (ref >60)
GFR calc non Af Amer: >60 mL/min (ref >60)
Glucose, Bld: 91 mg/dL (ref 65–99)
Potassium: 3.6 mmol/L (ref 3.5–5.1)
SODIUM: 139 mmol/L (ref 135–145)

## 2014-10-10 MED ORDER — HYDROMORPHONE HCL 2 MG/ML IJ SOLN
2.0000 mg | Freq: Once | INTRAMUSCULAR | Status: AC
  Administered 2014-10-10: 2 mg via INTRAMUSCULAR
  Filled 2014-10-10: qty 1

## 2014-10-10 NOTE — ED Provider Notes (Signed)
CSN: [id number]     Arrival date & time 10/10/14  1710 History   First MD Initiated Contact with Patient 10/10/14 2137     Chief Complaint  Patient presents with  . Hip Pain  . bilat leg pain      (Consider location/radiation/quality/duration/timing/severity/associated sxs/prior Treatment) HPI Comments: Pt in with c/o bilateral leg pain that started 3 days ago. Pt states that he is seen by pain management for chronic back pain and he told them that he was having pain and they added hydrocodone to his regimen. He states that it hasn't helped at all. Denies numbness, weakness or swelling to the area.   The history is provided by the patient. No language interpreter was used.    Past Medical History  Diagnosis Date  . Hypertension   . Sleep apnea     not used since 2011   . Arthritis   . History of gout    Past Surgical History  Procedure Laterality Date  . Knee arthroscopy      bilateral   . 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 Total knee arthroplasty Left 03/04/2014    Procedure: TOTAL LEFT KNEE ARTHROPLASTY;  Surgeon: Jacki Cones, MD;  Location: [facility];  Service: Orthopedics;  Laterality: Left;   No family history on file. Social History  Substance Use Topics  . Smoking status: Never Smoker   . Smokeless tobacco: Never Used  . Alcohol Use: No    Review of Systems  All other systems reviewed and are negative.     Allergies  Review of patient's allergies indicates no known allergies.  Home Medications   Prior to Admission medications   Medication Sig Start Date End Date Taking? Authorizing Provider  allopurinol (ZYLOPRIM) 100 MG tablet Take 100 mg by mouth 2 (two) times daily.    Historical Provider, MD  amLODipine (NORVASC) 10 MG tablet Take 10 mg by mouth daily before breakfast.    Historical Provider, MD  docusate sodium  (COLACE) 100 MG capsule Take 1 capsule (100 mg total) by mouth 2 (two) times daily. 03/05/14   Amber Celedonio Savage, PA-C  ferrous sulfate 325 (65 FE) MG tablet Take 1 tablet (325 mg total) by mouth 3 (three) times daily after meals. 04/18/12   Amber Constable, PA-C  Menthol, Topical Analgesic, (BENGAY EX) Apply 1 application topically daily as needed (For pain.).    Historical Provider, MD  methocarbamol (ROBAXIN) 500 MG tablet Take 1 tablet (500 mg total) by mouth every 6 (six) hours as needed. 03/05/14   Amber Constable, PA-C  oxyCODONE 10 MG TABS Take 1-2 tablets (10-20 mg total) by mouth every 3 (three) hours as needed for severe pain. 03/05/14   Amber Celedonio Savage, PA-C  oxymorphone (OPANA ER) 30 MG 12 hr tablet Take 30 mg by mouth every 12 (twelve) hours.     Historical Provider, MD  potassium chloride SA (K-DUR,KLOR-CON) 20 MEQ tablet Take 20 mEq by mouth 2 (two) times daily.    Historical Provider, MD  rivaroxaban (XARELTO) 10 MG TABS tablet Take 1 tablet (10 mg total) by mouth daily. 03/05/14   Amber Celedonio Savage, PA-C  sildenafil (VIAGRA) 100 MG tablet Take 100 mg by mouth daily as needed for erectile dysfunction.  07/29/13   Historical Provider, MD  triamterene-hydrochlorothiazide (DYAZIDE) 37.5-25 MG per capsule Take 1 capsule by mouth 2 (two) times  daily.    Historical Provider, MD   BP 133/77 mmHg  Pulse 82  Temp(Src) 98.7 F (37.1 C) (Oral)  Resp 20  SpO2 97% Physical Exam  Constitutional: He is oriented to person, place, and time. He appears well-developed and well-nourished.  HENT:  Head: Normocephalic and atraumatic.  Cardiovascular: Normal rate and regular rhythm.   Pulmonary/Chest: Effort normal and breath sounds normal.  Abdominal: Soft. Bowel sounds are normal.  Musculoskeletal: Normal range of motion.  No swelling noted to either lower extremities. Good strength bilaterally  Neurological: He is alert and oriented to person, place, and time. He exhibits normal muscle tone. Coordination  normal.  Good sensation and strength to bilateral lower extremities  Skin: Skin is warm and dry.  Psychiatric: He has a normal mood and affect.  Nursing note and vitals reviewed.   ED Course  Procedures (including critical care time) Labs Review Labs Reviewed  CBC WITH DIFFERENTIAL/PLATELET - Abnormal; Notable for the following:    RDW 15.7 (*)    Monocytes Absolute 1.3 (*)    All other components within normal limits  BASIC METABOLIC PANEL  CBC WITH DIFFERENTIAL/PLATELET    Imaging Review No results found. I have personally reviewed and evaluated these images and lab results as part of my medical decision-making.   EKG Interpretation None      MDM   Final diagnoses:  Bilateral lower extremity pain   Pt requesting home pain management. Discussed with pt that we were not going to do that today. Doubt acute process. Think likely and pain management problem. Pt is comfortable after a dose of dilaudid here    Teressa Lower, NP 10/11/14 4782  Benjiman Core, MD 10/11/14 [phone]

## 2014-10-10 NOTE — ED Notes (Addendum)
Pt states that on Wed started having pain from hips down bilat legs.  Pt denies any fall, injury or accident that could have caused the pain.  Pt states that the pain is so severe he hasnt been able to sleep. Pt states that the pain meds he takes for his back isnt helping with the pain.

## 2014-10-11 LAB — CBC WITH DIFFERENTIAL/PLATELET
BASOS PCT: 0 %
Basophils Absolute: 0 10*3/uL (ref 0.0–0.1)
EOS PCT: 2 %
Eosinophils Absolute: 0.2 10*3/uL (ref 0.0–0.7)
HCT: 41.9 % (ref 39.0–52.0)
Hemoglobin: 13.5 g/dL (ref 13.0–17.0)
LYMPHS PCT: 30 %
Lymphs Abs: 2.5 10*3/uL (ref 0.7–4.0)
MCH: 28.2 pg (ref 26.0–34.0)
MCHC: 32.2 g/dL (ref 30.0–36.0)
MCV: 87.5 fL (ref 78.0–100.0)
Monocytes Absolute: 1.3 10*3/uL — ABNORMAL HIGH (ref 0.1–1.0)
Monocytes Relative: 16 %
Neutro Abs: 4.4 10*3/uL (ref 1.7–7.7)
Neutrophils Relative %: 52 %
Platelets: 239 10*3/uL (ref 150–400)
RBC: 4.79 MIL/uL (ref 4.22–5.81)
RDW: 15.7 % — AB (ref 11.5–15.5)
WBC: 8.5 10*3/uL (ref 4.0–10.5)

## 2014-10-11 NOTE — Discharge Instructions (Signed)
Your lab work all looked good today. You need to follow up with your pain management doctor for continued symptoms Pain of Unknown Etiology (Pain Without a Known Cause) You have come to your caregiver because of pain. Pain can occur in any part of the body. Often there is not a definite cause. If your laboratory (blood or urine) work was normal and X-rays or other studies were normal, your caregiver may treat you without knowing the cause of the pain. An example of this is the headache. Most headaches are diagnosed by taking a history. This means your caregiver asks you questions about your headaches. Your caregiver determines a treatment based on your answers. Usually testing done for headaches is normal. Often testing is not done unless there is no response to medications. Regardless of where your pain is located today, you can be given medications to make you comfortable. If no physical cause of pain can be found, most cases of pain will gradually leave as suddenly as they came.  If you have a painful condition and no reason can be found for the pain, it is important that you follow up with your caregiver. If the pain becomes worse or does not go away, it may be necessary to repeat tests and look further for a possible cause.  Only take over-the-counter or prescription medicines for pain, discomfort, or fever as directed by your caregiver.  For the protection of your privacy, test results cannot be given over the phone. Make sure you receive the results of your test. Ask how these results are to be obtained if you have not been informed. It is your responsibility to obtain your test results.  You may continue all activities unless the activities cause more pain. When the pain lessens, it is important to gradually resume normal activities. Resume activities by beginning slowly and gradually increasing the intensity and duration of the activities or exercise. During periods of severe pain, bed rest may be  helpful. Lie or sit in any position that is comfortable.  Ice used for acute (sudden) conditions may be effective. Use a large plastic bag filled with ice and wrapped in a towel. This may provide pain relief.  See your caregiver for continued problems. Your caregiver can help or refer you for exercises or physical therapy if necessary. If you were given medications for your condition, do not drive, operate machinery or power tools, or sign legal documents for 24 hours. Do not drink alcohol, take sleeping pills, or take other medications that may interfere with treatment. See your caregiver immediately if you have pain that is becoming worse and not relieved by medications. Document Released: 09/27/2000 Document Revised: 10/23/2012 Document Reviewed: 01/02/2005 [facility] Patient Information 2015 [place], [place]. This information is not intended to replace advice given to you by your health care provider. Make sure you discuss any questions you have with your health care provider.

## 2017-03-12 ENCOUNTER — Other Ambulatory Visit: Payer: Self-pay | Admitting: Pain Medicine

## 2017-03-12 DIAGNOSIS — M545 Low back pain: Secondary | ICD-10-CM

## 2017-03-18 ENCOUNTER — Ambulatory Visit
Admission: RE | Admit: 2017-03-18 | Discharge: 2017-03-18 | Disposition: A | Discharge status: Home / Self Care | Payer: BLUE CROSS/BLUE SHIELD | Source: Ambulatory Visit | Attending: Pain Medicine | Admitting: Pain Medicine

## 2017-03-18 DIAGNOSIS — M545 Low back pain: Secondary | ICD-10-CM

## 2023-12-27 DIAGNOSIS — C61 Malignant neoplasm of prostate: Secondary | ICD-10-CM | POA: Diagnosis not present

## 2023-12-27 DIAGNOSIS — R109 Unspecified abdominal pain: Secondary | ICD-10-CM | POA: Diagnosis not present

## 2023-12-28 DIAGNOSIS — R109 Unspecified abdominal pain: Secondary | ICD-10-CM | POA: Diagnosis not present

## 2023-12-28 DIAGNOSIS — C61 Malignant neoplasm of prostate: Secondary | ICD-10-CM | POA: Diagnosis not present
